# Patient Record
Sex: Female | Born: 1975 | Race: White | Hispanic: No | Smoking: Current every day smoker
Health system: Southern US, Community
[De-identification: ages and names within clinical notes are randomized; demographics above are authoritative.]

## PROBLEM LIST (undated history)

## (undated) ENCOUNTER — Inpatient Hospital Stay (HOSPITAL_COMMUNITY): Payer: Self-pay

## (undated) DIAGNOSIS — C801 Malignant (primary) neoplasm, unspecified: Secondary | ICD-10-CM

## (undated) DIAGNOSIS — R51 Headache: Secondary | ICD-10-CM

## (undated) DIAGNOSIS — F419 Anxiety disorder, unspecified: Secondary | ICD-10-CM

## (undated) DIAGNOSIS — T8859XA Other complications of anesthesia, initial encounter: Secondary | ICD-10-CM

## (undated) DIAGNOSIS — T4145XA Adverse effect of unspecified anesthetic, initial encounter: Secondary | ICD-10-CM

## (undated) DIAGNOSIS — K7689 Other specified diseases of liver: Secondary | ICD-10-CM

## (undated) DIAGNOSIS — Z9889 Other specified postprocedural states: Secondary | ICD-10-CM

## (undated) DIAGNOSIS — N189 Chronic kidney disease, unspecified: Secondary | ICD-10-CM

## (undated) DIAGNOSIS — R519 Headache, unspecified: Secondary | ICD-10-CM

## (undated) DIAGNOSIS — R112 Nausea with vomiting, unspecified: Secondary | ICD-10-CM

## (undated) DIAGNOSIS — N200 Calculus of kidney: Secondary | ICD-10-CM

## (undated) DIAGNOSIS — Z8719 Personal history of other diseases of the digestive system: Secondary | ICD-10-CM

## (undated) DIAGNOSIS — J45909 Unspecified asthma, uncomplicated: Secondary | ICD-10-CM

## (undated) DIAGNOSIS — Z8489 Family history of other specified conditions: Secondary | ICD-10-CM

## (undated) HISTORY — PX: DIAGNOSTIC LAPAROSCOPY: SUR761

## (undated) HISTORY — PX: WISDOM TOOTH EXTRACTION: SHX21

## (undated) HISTORY — DX: Calculus of kidney: N20.0

## (undated) HISTORY — PX: UPPER GI ENDOSCOPY: SHX6162

---

## 1998-12-09 ENCOUNTER — Encounter: Admission: RE | Admit: 1998-12-09 | Discharge: 1998-12-09 | Payer: Self-pay | Admitting: Emergency Medicine

## 1998-12-09 ENCOUNTER — Encounter: Payer: Self-pay | Admitting: Emergency Medicine

## 1999-05-01 ENCOUNTER — Ambulatory Visit (HOSPITAL_COMMUNITY): Admission: RE | Admit: 1999-05-01 | Discharge: 1999-05-01 | Payer: Self-pay | Admitting: Gastroenterology

## 1999-05-01 ENCOUNTER — Encounter: Payer: Self-pay | Admitting: Gastroenterology

## 1999-05-06 ENCOUNTER — Ambulatory Visit (HOSPITAL_COMMUNITY): Admission: RE | Admit: 1999-05-06 | Discharge: 1999-05-06 | Payer: Self-pay | Admitting: Gastroenterology

## 2004-03-24 ENCOUNTER — Other Ambulatory Visit: Admission: RE | Admit: 2004-03-24 | Discharge: 2004-03-24 | Payer: Self-pay | Admitting: Family Medicine

## 2006-02-11 ENCOUNTER — Emergency Department: Payer: Self-pay | Admitting: Emergency Medicine

## 2007-01-05 ENCOUNTER — Emergency Department: Payer: Self-pay | Admitting: Emergency Medicine

## 2007-09-11 ENCOUNTER — Emergency Department: Payer: Self-pay | Admitting: Emergency Medicine

## 2007-12-15 ENCOUNTER — Emergency Department: Payer: Self-pay | Admitting: Emergency Medicine

## 2009-12-06 ENCOUNTER — Emergency Department: Payer: Self-pay | Admitting: Emergency Medicine

## 2010-07-07 ENCOUNTER — Inpatient Hospital Stay (INDEPENDENT_AMBULATORY_CARE_PROVIDER_SITE_OTHER)
Admission: RE | Admit: 2010-07-07 | Discharge: 2010-07-07 | Disposition: A | Discharge status: Home / Self Care | Payer: 59 | Source: Ambulatory Visit | Attending: Family Medicine | Admitting: Family Medicine

## 2010-07-07 DIAGNOSIS — J40 Bronchitis, not specified as acute or chronic: Secondary | ICD-10-CM

## 2010-11-27 ENCOUNTER — Emergency Department: Payer: Self-pay | Admitting: Emergency Medicine

## 2010-11-28 ENCOUNTER — Emergency Department: Payer: Self-pay | Admitting: Emergency Medicine

## 2013-12-14 ENCOUNTER — Emergency Department: Payer: Self-pay | Admitting: Emergency Medicine

## 2013-12-14 LAB — URINALYSIS, COMPLETE
BACTERIA: NONE SEEN
Bilirubin,UR: NEGATIVE
GLUCOSE, UR: NEGATIVE mg/dL (ref 0–75)
Ketone: NEGATIVE
Leukocyte Esterase: NEGATIVE
NITRITE: NEGATIVE
Ph: 6 (ref 4.5–8.0)
Specific Gravity: 1.02 (ref 1.003–1.030)
Squamous Epithelial: 3
WBC UR: 3 /HPF (ref 0–5)

## 2013-12-14 LAB — CBC
HCT: 42.4 % (ref 35.0–47.0)
HGB: 14.3 g/dL (ref 12.0–16.0)
MCH: 31.3 pg (ref 26.0–34.0)
MCHC: 33.6 g/dL (ref 32.0–36.0)
MCV: 93 fL (ref 80–100)
Platelet: 266 10*3/uL (ref 150–440)
RBC: 4.56 10*6/uL (ref 3.80–5.20)
RDW: 12.6 % (ref 11.5–14.5)
WBC: 6.7 10*3/uL (ref 3.6–11.0)

## 2013-12-14 LAB — COMPREHENSIVE METABOLIC PANEL
ALBUMIN: 3.7 g/dL (ref 3.4–5.0)
Alkaline Phosphatase: 38 U/L — ABNORMAL LOW
Anion Gap: 9 (ref 7–16)
BUN: 14 mg/dL (ref 7–18)
Bilirubin,Total: 0.6 mg/dL (ref 0.2–1.0)
CHLORIDE: 106 mmol/L (ref 98–107)
CO2: 27 mmol/L (ref 21–32)
Calcium, Total: 8.6 mg/dL (ref 8.5–10.1)
Creatinine: 0.93 mg/dL (ref 0.60–1.30)
EGFR (African American): >60
GLUCOSE: 97 mg/dL (ref 65–99)
Osmolality: 284 (ref 275–301)
Potassium: 3.9 mmol/L (ref 3.5–5.1)
SGOT(AST): 10 U/L — ABNORMAL LOW (ref 15–37)
SGPT (ALT): 16 U/L
Sodium: 142 mmol/L (ref 136–145)
TOTAL PROTEIN: 6.8 g/dL (ref 6.4–8.2)

## 2014-03-14 ENCOUNTER — Ambulatory Visit (INDEPENDENT_AMBULATORY_CARE_PROVIDER_SITE_OTHER): Payer: Self-pay | Admitting: General Surgery

## 2014-03-28 ENCOUNTER — Other Ambulatory Visit (HOSPITAL_COMMUNITY): Payer: Self-pay | Admitting: *Deleted

## 2014-03-29 ENCOUNTER — Encounter (HOSPITAL_COMMUNITY): Payer: Self-pay

## 2014-03-29 ENCOUNTER — Ambulatory Visit (INDEPENDENT_AMBULATORY_CARE_PROVIDER_SITE_OTHER): Payer: Self-pay | Admitting: General Surgery

## 2014-03-29 ENCOUNTER — Encounter (HOSPITAL_COMMUNITY)
Admission: RE | Admit: 2014-03-29 | Discharge: 2014-03-29 | Disposition: A | Discharge status: Home / Self Care | Payer: Medicaid Other | Source: Ambulatory Visit | Attending: General Surgery | Admitting: General Surgery

## 2014-03-29 DIAGNOSIS — J45909 Unspecified asthma, uncomplicated: Secondary | ICD-10-CM | POA: Diagnosis not present

## 2014-03-29 DIAGNOSIS — Z79899 Other long term (current) drug therapy: Secondary | ICD-10-CM | POA: Diagnosis not present

## 2014-03-29 DIAGNOSIS — Z87442 Personal history of urinary calculi: Secondary | ICD-10-CM | POA: Diagnosis not present

## 2014-03-29 DIAGNOSIS — F17200 Nicotine dependence, unspecified, uncomplicated: Secondary | ICD-10-CM | POA: Diagnosis not present

## 2014-03-29 DIAGNOSIS — K219 Gastro-esophageal reflux disease without esophagitis: Secondary | ICD-10-CM | POA: Diagnosis not present

## 2014-03-29 DIAGNOSIS — Z8541 Personal history of malignant neoplasm of cervix uteri: Secondary | ICD-10-CM | POA: Diagnosis not present

## 2014-03-29 DIAGNOSIS — G43909 Migraine, unspecified, not intractable, without status migrainosus: Secondary | ICD-10-CM | POA: Diagnosis not present

## 2014-03-29 DIAGNOSIS — K8020 Calculus of gallbladder without cholecystitis without obstruction: Secondary | ICD-10-CM | POA: Diagnosis not present

## 2014-03-29 DIAGNOSIS — Z859 Personal history of malignant neoplasm, unspecified: Secondary | ICD-10-CM | POA: Diagnosis not present

## 2014-03-29 DIAGNOSIS — F329 Major depressive disorder, single episode, unspecified: Secondary | ICD-10-CM | POA: Diagnosis not present

## 2014-03-29 HISTORY — DX: Other specified diseases of liver: K76.89

## 2014-03-29 HISTORY — DX: Anxiety disorder, unspecified: F41.9

## 2014-03-29 HISTORY — DX: Headache, unspecified: R51.9

## 2014-03-29 HISTORY — DX: Other specified postprocedural states: R11.2

## 2014-03-29 HISTORY — DX: Headache: R51

## 2014-03-29 HISTORY — DX: Unspecified asthma, uncomplicated: J45.909

## 2014-03-29 HISTORY — DX: Adverse effect of unspecified anesthetic, initial encounter: T41.45XA

## 2014-03-29 HISTORY — DX: Nausea with vomiting, unspecified: Z98.890

## 2014-03-29 HISTORY — DX: Family history of other specified conditions: Z84.89

## 2014-03-29 HISTORY — DX: Other complications of anesthesia, initial encounter: T88.59XA

## 2014-03-29 HISTORY — DX: Chronic kidney disease, unspecified: N18.9

## 2014-03-29 HISTORY — DX: Personal history of other diseases of the digestive system: Z87.19

## 2014-03-29 HISTORY — DX: Malignant (primary) neoplasm, unspecified: C80.1

## 2014-03-29 LAB — BASIC METABOLIC PANEL
ANION GAP: 9 (ref 5–15)
BUN: 8 mg/dL (ref 6–23)
CO2: 23 mmol/L (ref 19–32)
CREATININE: 0.71 mg/dL (ref 0.50–1.10)
Calcium: 9.1 mg/dL (ref 8.4–10.5)
Chloride: 107 mmol/L (ref 96–112)
GFR calc non Af Amer: >90 mL/min (ref >90)
Glucose, Bld: 86 mg/dL (ref 70–99)
POTASSIUM: 3.7 mmol/L (ref 3.5–5.1)
Sodium: 139 mmol/L (ref 135–145)

## 2014-03-29 LAB — HCG, SERUM, QUALITATIVE: PREG SERUM: NEGATIVE

## 2014-03-29 LAB — CBC
HCT: 42.6 % (ref 36.0–46.0)
HEMOGLOBIN: 14.4 g/dL (ref 12.0–15.0)
MCH: 30.8 pg (ref 26.0–34.0)
MCHC: 33.8 g/dL (ref 30.0–36.0)
MCV: 91.2 fL (ref 78.0–100.0)
PLATELETS: 250 10*3/uL (ref 150–400)
RBC: 4.67 MIL/uL (ref 3.87–5.11)
RDW: 13.1 % (ref 11.5–15.5)
WBC: 5.2 10*3/uL (ref 4.0–10.5)

## 2014-03-29 NOTE — Progress Notes (Signed)
Pt. Denies any changes, concerns about her breathing or heart. Pt. Thought Dr. Grandville Silos would be doing liver biopsy also with surgery.  Call to [facility], reported the same to nurse; also to alert Dr. Grandville Silos, pt.  has had hives with PCN & Ceclor in the past.

## 2014-03-29 NOTE — Pre-Procedure Instructions (Signed)
Essance Gatti  03/29/2014   Your procedure is scheduled on:  04/02/2014  Report to [facility] Admitting at 5:30 AM.  Call this number if you have problems the morning of surgery: [phone]   Remember:   Do not eat food or drink liquids after midnight.  On Sunday night    Take these medicines the morning of surgery with A SIP OF WATER: you can have Lorazepam if needed a.m. Of surgery    Do not wear jewelry, make-up or nail polish.   Do not wear lotions, powders, or perfumes. You may wear deodorant.   Do not shave 48 hours prior to surgery. Men may shave face and neck.  Do not bring valuables to the hospital.  [facility] is not responsible                  for any belongings or valuables.               Contacts, dentures or bridgework may not be worn into surgery.   Leave suitcase in the car. After surgery it may be brought to your room.   For patients admitted to the hospital, discharge time is determined by your                treatment team.               Patients discharged the day of surgery will not be allowed to drive  home.  Name and phone number of your driver: /w pt.'s Mother   Special Instructions: Special Instructions:  - Preparing for Surgery  Before surgery, you can play an important role.  Because skin is not sterile, your skin needs to be as free of germs as possible.  You can reduce the number of germs on you skin by washing with CHG (chlorahexidine gluconate) soap before surgery.  CHG is an antiseptic cleaner which kills germs and bonds with the skin to continue killing germs even after washing.  Please DO NOT use if you have an allergy to CHG or antibacterial soaps.  If your skin becomes reddened/irritated stop using the CHG and inform your nurse when you arrive at Short Stay.  Do not shave (including legs and underarms) for at least 48 hours prior to the first CHG shower.  You may shave your face.  Please follow these instructions  carefully:   1.  Shower with CHG Soap the night before surgery and the  morning of Surgery.  2.  If you choose to wash your hair, wash your hair first as usual with your  normal shampoo.  3.  After you shampoo, rinse your hair and body thoroughly to remove the  Shampoo.  4.  Use CHG as you would any other liquid soap.  You can apply chg directly to the skin and wash gently with scrungie or a clean washcloth.  5.  Apply the CHG Soap to your body ONLY FROM THE NECK DOWN.    Do not use on open wounds or open sores.  Avoid contact with your eyes, ears, mouth and genitals (private parts).  Wash genitals (private parts)   with your normal soap.  6.  Wash thoroughly, paying special attention to the area where your surgery will be performed.  7.  Thoroughly rinse your body with warm water from the neck down.  8.  DO NOT shower/wash with your normal soap after using and rinsing off   the CHG Soap.  9.  Pat yourself dry with a clean towel.            10.  Wear clean pajamas.            11.  Place clean sheets on your bed the night of your first shower and do not sleep with pets.  Day of Surgery  Do not apply any lotions/deodorants the morning of surgery.  Please wear clean clothes to the hospital/surgery center.   Please read over the following fact sheets that you were given: Pain Booklet, Coughing and Deep Breathing and Surgical Site Infection Prevention

## 2014-04-01 MED ORDER — CIPROFLOXACIN IN D5W 400 MG/200ML IV SOLN
400.0000 mg | Freq: Two times a day (BID) | INTRAVENOUS | Status: DC
  Administered 2014-04-02: 400 mg via INTRAVENOUS
  Filled 2014-04-01: qty 200

## 2014-04-02 ENCOUNTER — Ambulatory Visit (HOSPITAL_COMMUNITY): Payer: Medicaid Other | Admitting: Anesthesiology

## 2014-04-02 ENCOUNTER — Encounter (HOSPITAL_COMMUNITY): Payer: Self-pay

## 2014-04-02 ENCOUNTER — Ambulatory Visit (HOSPITAL_COMMUNITY)
Admission: RE | Admit: 2014-04-02 | Discharge: 2014-04-02 | Disposition: A | Discharge status: Home / Self Care | Payer: Medicaid Other | Source: Ambulatory Visit | Attending: General Surgery | Admitting: General Surgery

## 2014-04-02 ENCOUNTER — Encounter (HOSPITAL_COMMUNITY)
Admission: RE | Disposition: A | Discharge status: Home / Self Care | Payer: Self-pay | Source: Ambulatory Visit | Attending: General Surgery

## 2014-04-02 DIAGNOSIS — K219 Gastro-esophageal reflux disease without esophagitis: Secondary | ICD-10-CM | POA: Diagnosis not present

## 2014-04-02 DIAGNOSIS — Z859 Personal history of malignant neoplasm, unspecified: Secondary | ICD-10-CM | POA: Insufficient documentation

## 2014-04-02 DIAGNOSIS — Z87442 Personal history of urinary calculi: Secondary | ICD-10-CM | POA: Insufficient documentation

## 2014-04-02 DIAGNOSIS — Z79899 Other long term (current) drug therapy: Secondary | ICD-10-CM | POA: Insufficient documentation

## 2014-04-02 DIAGNOSIS — K8020 Calculus of gallbladder without cholecystitis without obstruction: Secondary | ICD-10-CM | POA: Diagnosis not present

## 2014-04-02 DIAGNOSIS — G43909 Migraine, unspecified, not intractable, without status migrainosus: Secondary | ICD-10-CM | POA: Insufficient documentation

## 2014-04-02 DIAGNOSIS — F329 Major depressive disorder, single episode, unspecified: Secondary | ICD-10-CM | POA: Diagnosis not present

## 2014-04-02 DIAGNOSIS — Z8541 Personal history of malignant neoplasm of cervix uteri: Secondary | ICD-10-CM | POA: Insufficient documentation

## 2014-04-02 DIAGNOSIS — J45909 Unspecified asthma, uncomplicated: Secondary | ICD-10-CM | POA: Insufficient documentation

## 2014-04-02 DIAGNOSIS — F17200 Nicotine dependence, unspecified, uncomplicated: Secondary | ICD-10-CM | POA: Insufficient documentation

## 2014-04-02 HISTORY — PX: CHOLECYSTECTOMY: SHX55

## 2014-04-02 SURGERY — LAPAROSCOPIC CHOLECYSTECTOMY WITH INTRAOPERATIVE CHOLANGIOGRAM
Anesthesia: General

## 2014-04-02 MED ORDER — ROCURONIUM BROMIDE 50 MG/5ML IV SOLN
INTRAVENOUS | Status: AC
  Filled 2014-04-02: qty 1

## 2014-04-02 MED ORDER — EPHEDRINE SULFATE 50 MG/ML IJ SOLN
INTRAMUSCULAR | Status: AC
  Filled 2014-04-02: qty 1

## 2014-04-02 MED ORDER — PROPOFOL 10 MG/ML IV BOLUS
INTRAVENOUS | Status: AC
  Filled 2014-04-02: qty 20

## 2014-04-02 MED ORDER — MIDAZOLAM HCL 5 MG/5ML IJ SOLN
INTRAMUSCULAR | Status: DC | PRN
  Administered 2014-04-02 (×2): 1 mg via INTRAVENOUS

## 2014-04-02 MED ORDER — DIPHENHYDRAMINE HCL 50 MG/ML IJ SOLN
INTRAMUSCULAR | Status: AC
  Filled 2014-04-02: qty 1

## 2014-04-02 MED ORDER — PROPOFOL 10 MG/ML IV BOLUS
INTRAVENOUS | Status: DC | PRN
  Administered 2014-04-02: 140 mg via INTRAVENOUS

## 2014-04-02 MED ORDER — OXYCODONE HCL 5 MG PO TABS: 5.0000 mg | ORAL_TABLET | Freq: Four times a day (QID) | ORAL | Status: DC | PRN

## 2014-04-02 MED ORDER — DIPHENHYDRAMINE HCL 50 MG/ML IJ SOLN
12.5000 mg | Freq: Once | INTRAMUSCULAR | Status: AC
  Administered 2014-04-02: 12.5 mg via INTRAVENOUS

## 2014-04-02 MED ORDER — PROMETHAZINE HCL 25 MG/ML IJ SOLN
12.5000 mg | INTRAMUSCULAR | Status: DC | PRN
  Filled 2014-04-02: qty 1

## 2014-04-02 MED ORDER — LIDOCAINE HCL (CARDIAC) 20 MG/ML IV SOLN
INTRAVENOUS | Status: AC
  Filled 2014-04-02: qty 5

## 2014-04-02 MED ORDER — FENTANYL CITRATE 0.05 MG/ML IJ SOLN
INTRAMUSCULAR | Status: AC
  Filled 2014-04-02: qty 5

## 2014-04-02 MED ORDER — SCOPOLAMINE 1 MG/3DAYS TD PT72
MEDICATED_PATCH | TRANSDERMAL | Status: AC
  Administered 2014-04-02: 1 via TRANSDERMAL
  Filled 2014-04-02: qty 1

## 2014-04-02 MED ORDER — DEXAMETHASONE SODIUM PHOSPHATE 4 MG/ML IJ SOLN
INTRAMUSCULAR | Status: DC | PRN
  Administered 2014-04-02: 4 mg via INTRAVENOUS

## 2014-04-02 MED ORDER — FENTANYL CITRATE 0.05 MG/ML IJ SOLN
INTRAMUSCULAR | Status: DC | PRN
  Administered 2014-04-02: 150 ug via INTRAVENOUS
  Administered 2014-04-02 (×4): 50 ug via INTRAVENOUS

## 2014-04-02 MED ORDER — BUPIVACAINE-EPINEPHRINE (PF) 0.25% -1:200000 IJ SOLN
INTRAMUSCULAR | Status: AC
  Filled 2014-04-02: qty 30

## 2014-04-02 MED ORDER — 0.9 % SODIUM CHLORIDE (POUR BTL) OPTIME
TOPICAL | Status: DC | PRN
  Administered 2014-04-02: 1000 mL

## 2014-04-02 MED ORDER — BUPIVACAINE-EPINEPHRINE 0.25% -1:200000 IJ SOLN
INTRAMUSCULAR | Status: DC | PRN
  Administered 2014-04-02: 20 mL

## 2014-04-02 MED ORDER — SUCCINYLCHOLINE CHLORIDE 20 MG/ML IJ SOLN
INTRAMUSCULAR | Status: AC
  Filled 2014-04-02: qty 1

## 2014-04-02 MED ORDER — PROMETHAZINE HCL 12.5 MG PO TABS: 12.5000 mg | ORAL_TABLET | Freq: Four times a day (QID) | ORAL | Status: DC | PRN

## 2014-04-02 MED ORDER — GLYCOPYRROLATE 0.2 MG/ML IJ SOLN
INTRAMUSCULAR | Status: AC
  Filled 2014-04-02: qty 2

## 2014-04-02 MED ORDER — HYDROMORPHONE HCL 1 MG/ML IJ SOLN
INTRAMUSCULAR | Status: AC
  Filled 2014-04-02: qty 1

## 2014-04-02 MED ORDER — OXYCODONE HCL 5 MG PO TABS: 5.0000 mg | ORAL_TABLET | ORAL | Status: DC | PRN

## 2014-04-02 MED ORDER — ARTIFICIAL TEARS OP OINT
TOPICAL_OINTMENT | OPHTHALMIC | Status: DC | PRN
  Administered 2014-04-02: 1 via OPHTHALMIC

## 2014-04-02 MED ORDER — ROCURONIUM BROMIDE 100 MG/10ML IV SOLN
INTRAVENOUS | Status: DC | PRN
  Administered 2014-04-02: 40 mg via INTRAVENOUS

## 2014-04-02 MED ORDER — GLYCOPYRROLATE 0.2 MG/ML IJ SOLN
INTRAMUSCULAR | Status: DC | PRN
  Administered 2014-04-02: 0.6 mg via INTRAVENOUS

## 2014-04-02 MED ORDER — DIPHENHYDRAMINE HCL 50 MG/ML IJ SOLN
INTRAMUSCULAR | Status: DC | PRN
  Administered 2014-04-02: 12.5 mg via INTRAVENOUS

## 2014-04-02 MED ORDER — PHENYLEPHRINE HCL 10 MG/ML IJ SOLN
INTRAMUSCULAR | Status: DC | PRN
  Administered 2014-04-02: 80 ug via INTRAVENOUS

## 2014-04-02 MED ORDER — SODIUM CHLORIDE 0.9 % IJ SOLN
INTRAMUSCULAR | Status: AC
  Filled 2014-04-02: qty 10

## 2014-04-02 MED ORDER — GLYCOPYRROLATE 0.2 MG/ML IJ SOLN
INTRAMUSCULAR | Status: AC
  Filled 2014-04-02: qty 3

## 2014-04-02 MED ORDER — NEOSTIGMINE METHYLSULFATE 10 MG/10ML IV SOLN
INTRAVENOUS | Status: DC | PRN
  Administered 2014-04-02: 4 mg via INTRAVENOUS

## 2014-04-02 MED ORDER — ONDANSETRON HCL 4 MG/2ML IJ SOLN
INTRAMUSCULAR | Status: DC | PRN
Start: 2014-04-02 — End: 2014-04-02
  Administered 2014-04-02: 4 mg via INTRAVENOUS

## 2014-04-02 MED ORDER — DEXAMETHASONE SODIUM PHOSPHATE 4 MG/ML IJ SOLN
INTRAMUSCULAR | Status: AC
  Filled 2014-04-02: qty 1

## 2014-04-02 MED ORDER — SODIUM CHLORIDE 0.9 % IV SOLN
INTRAVENOUS | Status: DC | PRN
  Administered 2014-04-02: 50 mL

## 2014-04-02 MED ORDER — PHENYLEPHRINE 40 MCG/ML (10ML) SYRINGE FOR IV PUSH (FOR BLOOD PRESSURE SUPPORT)
PREFILLED_SYRINGE | INTRAVENOUS | Status: AC
  Filled 2014-04-02: qty 10

## 2014-04-02 MED ORDER — HYDROMORPHONE HCL 1 MG/ML IJ SOLN
0.2500 mg | INTRAMUSCULAR | Status: DC | PRN
  Administered 2014-04-02 (×4): 0.5 mg via INTRAVENOUS

## 2014-04-02 MED ORDER — MIDAZOLAM HCL 2 MG/2ML IJ SOLN
INTRAMUSCULAR | Status: AC
  Filled 2014-04-02: qty 2

## 2014-04-02 MED ORDER — CHLORHEXIDINE GLUCONATE 4 % EX LIQD
1.0000 "application " | Freq: Once | CUTANEOUS | Status: DC
  Filled 2014-04-02: qty 15

## 2014-04-02 MED ORDER — ONDANSETRON HCL 4 MG/2ML IJ SOLN
INTRAMUSCULAR | Status: AC
  Filled 2014-04-02: qty 2

## 2014-04-02 MED ORDER — LIDOCAINE HCL 4 % MT SOLN
OROMUCOSAL | Status: DC | PRN
  Administered 2014-04-02: 4 mL via TOPICAL

## 2014-04-02 MED ORDER — SODIUM CHLORIDE 0.9 % IR SOLN
Status: DC | PRN
  Administered 2014-04-02: 1000 mL

## 2014-04-02 MED ORDER — LACTATED RINGERS IV SOLN
INTRAVENOUS | Status: DC | PRN
  Administered 2014-04-02: 07:00:00 via INTRAVENOUS

## 2014-04-02 SURGICAL SUPPLY — 46 items
APPLIER CLIP 5 13 M/L LIGAMAX5 (MISCELLANEOUS) ×3
APR CLP MED LRG 5 ANG JAW (MISCELLANEOUS) ×1
BAG SPEC RTRVL 10 TROC 200 (ENDOMECHANICALS) ×1
CANISTER SUCTION 2500CC (MISCELLANEOUS) ×3 IMPLANT
CHLORAPREP W/TINT 26ML (MISCELLANEOUS) ×3 IMPLANT
CLIP APPLIE 5 13 M/L LIGAMAX5 (MISCELLANEOUS) ×1 IMPLANT
CLOSURE STERI-STRIP 1/2X4 (GAUZE/BANDAGES/DRESSINGS) ×1
CLSR STERI-STRIP ANTIMIC 1/2X4 (GAUZE/BANDAGES/DRESSINGS) ×1 IMPLANT
COVER MAYO STAND STRL (DRAPES) ×3 IMPLANT
COVER SURGICAL LIGHT HANDLE (MISCELLANEOUS) ×3 IMPLANT
DRAPE C-ARM 42X72 X-RAY (DRAPES) ×3 IMPLANT
DRAPE LAPAROSCOPIC ABDOMINAL (DRAPES) ×3 IMPLANT
DRSG TEGADERM 2-3/8X2-3/4 SM (GAUZE/BANDAGES/DRESSINGS) ×4 IMPLANT
ELECT REM PT RETURN 9FT ADLT (ELECTROSURGICAL) ×3
ELECTRODE REM PT RTRN 9FT ADLT (ELECTROSURGICAL) ×1 IMPLANT
FILTER SMOKE EVAC LAPAROSHD (FILTER) ×2 IMPLANT
GAUZE SPONGE 2X2 8PLY STRL LF (GAUZE/BANDAGES/DRESSINGS) IMPLANT
GLOVE BIO SURGEON STRL SZ7 (GLOVE) ×4 IMPLANT
GLOVE BIO SURGEON STRL SZ8 (GLOVE) ×5 IMPLANT
GLOVE BIOGEL PI IND STRL 7.0 (GLOVE) IMPLANT
GLOVE BIOGEL PI IND STRL 8 (GLOVE) ×1 IMPLANT
GLOVE BIOGEL PI INDICATOR 7.0 (GLOVE) ×2
GLOVE BIOGEL PI INDICATOR 8 (GLOVE) ×2
GOWN STRL REUS W/ TWL LRG LVL3 (GOWN DISPOSABLE) ×2 IMPLANT
GOWN STRL REUS W/ TWL XL LVL3 (GOWN DISPOSABLE) ×1 IMPLANT
GOWN STRL REUS W/TWL LRG LVL3 (GOWN DISPOSABLE) ×6
GOWN STRL REUS W/TWL XL LVL3 (GOWN DISPOSABLE) ×3
KIT BASIN OR (CUSTOM PROCEDURE TRAY) ×3 IMPLANT
KIT ROOM TURNOVER OR (KITS) ×3 IMPLANT
LIQUID BAND (GAUZE/BANDAGES/DRESSINGS) ×3 IMPLANT
NS IRRIG 1000ML POUR BTL (IV SOLUTION) ×3 IMPLANT
PAD ARMBOARD 7.5X6 YLW CONV (MISCELLANEOUS) ×3 IMPLANT
POUCH RETRIEVAL ECOSAC 10 (ENDOMECHANICALS) ×1 IMPLANT
POUCH RETRIEVAL ECOSAC 10MM (ENDOMECHANICALS) ×2
SCISSORS LAP 5X35 DISP (ENDOMECHANICALS) ×3 IMPLANT
SET CHOLANGIOGRAPH 5 50 .035 (SET/KITS/TRAYS/PACK) ×3 IMPLANT
SET IRRIG TUBING LAPAROSCOPIC (IRRIGATION / IRRIGATOR) ×3 IMPLANT
SLEEVE ENDOPATH XCEL 5M (ENDOMECHANICALS) ×6 IMPLANT
SPECIMEN JAR SMALL (MISCELLANEOUS) ×3 IMPLANT
SPONGE GAUZE 2X2 STER 10/PKG (GAUZE/BANDAGES/DRESSINGS) ×2
SUT VIC AB 4-0 PS2 27 (SUTURE) ×3 IMPLANT
TOWEL OR 17X26 10 PK STRL BLUE (TOWEL DISPOSABLE) ×3 IMPLANT
TRAY LAPAROSCOPIC (CUSTOM PROCEDURE TRAY) ×3 IMPLANT
TROCAR XCEL BLUNT TIP 100MML (ENDOMECHANICALS) ×3 IMPLANT
TROCAR XCEL NON-BLD 5MMX100MML (ENDOMECHANICALS) ×3 IMPLANT
TUBING INSUFFLATION (TUBING) ×3 IMPLANT

## 2014-04-02 NOTE — Transfer of Care (Signed)
Immediate Anesthesia Transfer of Care Note  Patient: Savannah Moore  Procedure(s) Performed: Procedure(s): LAPAROSCOPIC CHOLECYSTECTOMY (N/A)  Patient Location: PACU  Anesthesia Type:General  Level of Consciousness: awake, alert  and oriented  Airway & Oxygen Therapy: Patient Spontanous Breathing and Patient connected to nasal cannula oxygen  Post-op Assessment: Report given to RN and Post -op Vital signs reviewed and stable  Post vital signs: Reviewed and stable  Last Vitals:  Filed Vitals:   04/02/14 0602  BP: 111/64  Pulse: 77  Temp: 36.5 C  Resp: 18    Complications: No apparent anesthesia complications

## 2014-04-02 NOTE — Discharge Instructions (Signed)
What to eat: ° °For your first meals, you should eat lightly; only small meals initially.  If you do not have nausea, you may eat larger meals.  Avoid spicy, greasy and heavy food.   ° °General Anesthesia, Adult, Care After  °Refer to this sheet in the next few weeks. These instructions provide you with information on caring for yourself after your procedure. Your health care provider may also give you more specific instructions. Your treatment has been planned according to current medical practices, but problems sometimes occur. Call your health care provider if you have any problems or questions after your procedure.  °WHAT TO EXPECT AFTER THE PROCEDURE  °After the procedure, it is typical to experience:  °Sleepiness.  °Nausea and vomiting. °HOME CARE INSTRUCTIONS  °For the first 24 hours after general anesthesia:  °Have a responsible person with you.  °Do not drive a car. If you are alone, do not take public transportation.  °Do not drink alcohol.  °Do not take medicine that has not been prescribed by your health care provider.  °Do not sign important papers or make important decisions.  °You may resume a normal diet and activities as directed by your health care provider.  °Change bandages (dressings) as directed.  °If you have questions or problems that seem related to general anesthesia, call the hospital and ask for the anesthetist or anesthesiologist on call. °SEEK MEDICAL CARE IF:  °You have nausea and vomiting that continue the day after anesthesia.  °You develop a rash. °SEEK IMMEDIATE MEDICAL CARE IF:  °You have difficulty breathing.  °You have chest pain.  °You have any allergic problems. °Document Released: 04/27/2000 Document Revised: 09/21/2012 Document Reviewed: 08/04/2012  °ExitCare® Patient Information ©2014 [employer].  ° °Sore Throat  ° ° °A sore throat is a painful, burning, sore, or scratchy feeling of the throat. There may be pain or tenderness when swallowing or talking. You may have  other symptoms with a sore throat. These include coughing, sneezing, fever, or a swollen neck. A sore throat is often the first sign of another sickness. These sicknesses may include a cold, flu, strep throat, or an infection called mono. Most sore throats go away without medical treatment.  °HOME CARE  °Only take medicine as told by your doctor.  °Drink enough fluids to keep your pee (urine) clear or pale yellow.  °Rest as needed.  °Try using throat sprays, lozenges, or suck on hard candy (if older than 4 years or as told).  °Sip warm liquids, such as broth, herbal tea, or warm water with honey. Try sucking on frozen ice pops or drinking cold liquids.  °Rinse the mouth (gargle) with salt water. Mix 1 teaspoon salt with 8 ounces of water.  °Do not smoke. Avoid being around others when they are smoking.  °Put a humidifier in your bedroom at night to moisten the air. You can also turn on a hot shower and sit in the bathroom for 5-10 minutes. Be sure the bathroom door is closed. °GET HELP RIGHT AWAY IF:  °You have trouble breathing.  °You cannot swallow fluids, soft foods, or your spit (saliva).  °You have more puffiness (swelling) in the throat.  °Your sore throat does not get better in 7 days.  °You feel sick to your stomach (nauseous) and throw up (vomit).  °You have a fever or lasting symptoms for more than 2-3 days.  °You have a fever and your symptoms suddenly get worse. °MAKE SURE YOU:  °Understand these   instructions.  °Will watch your condition.  °Will get help right away if you are not doing well or get worse. °Document Released: 10/29/2007 Document Revised: 10/14/2011 Document Reviewed: 09/27/2011  °ExitCare® Patient Information ©2015 [employer]. This information is not intended to replace advice given to you by your health care provider. Make sure you discuss any questions you have with your health care provider.  ° ° °Tissue Adhesive Wound Care  ° ° °Some cuts and wounds can be closed with tissue  adhesive. Adhesive is like glue. It holds the skin together and helps a wound heal faster. This adhesive goes away on its own as the wound heals.  °HOME CARE  °Showers are allowed. Do not soak the wound in water. Do not take baths, swim, or use hot tubs. Do not use soaps or creams on your wound.  °If a bandage (dressing) was put on, change it as often as told by your doctor.  °Keep the bandage dry.  °Do not scratch, pick, or rub the adhesive.  °Do not put tape over the adhesive. The adhesive could come off.  °Protect the wound from another injury.  °Protect the wound from sun and tanning beds.  °Only take medicine as told by your doctor.  °Keep all doctor visits as told. °GET HELP RIGHT AWAY IF:  °Your wound is red, puffy (swollen), hot, or tender.  °You get a rash after the glue is put on.  °You have more pain in the wound.  °You have a red streak going away from the wound.  °You have yellowish-white fluid (pus) coming from the wound.  °You have more bleeding.  °You have a fever.  °You have chills and start to shake.  °You notice a bad smell coming from the wound.  °Your wound or adhesive breaks open. °MAKE SURE YOU:  °Understand these instructions.  °Will watch your condition.  °Will get help right away if you are not doing well or get worse. °Document Released: 10/29/2007 Document Revised: 11/09/2012 Document Reviewed: 08/10/2012  °ExitCare® Patient Information ©2015 [employer]. This information is not intended to replace advice given to you by your health care provider. Make sure you discuss any questions you have with your health care provider.  ° ° °

## 2014-04-02 NOTE — Anesthesia Procedure Notes (Signed)
Procedure Name: Intubation Date/Time: 04/02/2014 7:37 AM Performed by: Susa Loffler Pre-anesthesia Checklist: Patient identified, Timeout performed, Emergency Drugs available, Suction available and Patient being monitored Patient Re-evaluated:Patient Re-evaluated prior to inductionOxygen Delivery Method: Circle system utilized Preoxygenation: Pre-oxygenation with 100% oxygen Intubation Type: IV induction Ventilation: Mask ventilation without difficulty Laryngoscope Size: Mac and 3 Grade View: Grade I Tube type: Oral Tube size: 7.0 mm Number of attempts: 1 Airway Equipment and Method: Stylet and LTA kit utilized Placement Confirmation: ETT inserted through vocal cords under direct vision,  positive ETCO2 and breath sounds checked- equal and bilateral Secured at: 21 cm Tube secured with: Tape Dental Injury: Teeth and Oropharynx as per pre-operative assessment

## 2014-04-02 NOTE — H&P (Signed)
History of Present Illness Savannah Moore E. Grandville Silos MD; 03/14/2014 10:13 AM) Patient words: eval abd pain.  The patient is a 39 year old female who presents for evaluation of gallbladder disease. Savannah Moore has a many year history of GI complaints and issues. She had gastric emptying study and upper GI done in 2001 by Dr. Deatra Ina. She had a very large ovarian cyst and once that was removed, most of her GI complaints improved. However for about the past 6 or 7 years, she has had GI problems again. This usually consists of nausea and vomiting with bilious material. She has upper abdominal pressure extending across both sides. The episodes usually resolve spontaneously but have caused her significant issues and trouble with absences from her jobs. She underwent evaluation at [facility] emergency Department in November for a kidney stone.. At that time, she had a CT scan of her abdomen and pelvis which also showed a 1.1 cm gallstone. I was asked to see her in consultation to consider cholecystectomy.   Other Problems Savannah Moore [place], Savannah Moore; 6/56/8127 5:17 AM) Anxiety Disorder Cervical Cancer Cholelithiasis Depression Gastroesophageal Reflux Disease General anesthesia - complications Kidney Stone Migraine Headache Other disease, cancer, significant illness  Past Surgical History Savannah Pierini, Savannah Moore; 0/02/7492 4:96 AM) Oral Surgery  Diagnostic Studies History Savannah Moore [place], Savannah Moore; 7/59/1638 4:66 AM) Colonoscopy >10 years ago Mammogram never Pap Smear 1-5 years ago  Allergies Savannah Pierini, Savannah Moore; 5/99/3570 1:77 AM) Penicillin G Potassium *PENICILLINS* Ceclor *CEPHALOSPORINS* Codeine Phosphate *ANALGESICS - OPIOID* Flagyl *ANTI-INFECTIVE AGENTS - MISC.*  Medication History Savannah Pierini, Savannah Moore; 9/39/0300 9:23 AM) Hydrocodone-Acetaminophen (5-325MG  Tablet, Oral) Active. SUMAtriptan Succinate (Oral) Specific dose unknown - Active. Sprintec 28 (Oral) Specific dose unknown -  Active.  Social History Savannah Moore [place], Savannah Moore; 3/00/7622 6:33 AM) Alcohol use Occasional alcohol use. Caffeine use Carbonated beverages, Tea. No drug use Tobacco use Current every day smoker.  Family History Savannah Pierini, Savannah Moore; 3/54/5625 6:38 AM) Anesthetic complications Family Members In General, Father. Arthritis Family Members In General, Father. Cerebrovascular Accident Family Members In [place] Members In General. Colon Polyps Father, Mother. Depression Mother. Diabetes Mellitus Family Members In General. Heart Disease Family Members In General, Father. Heart disease in female family member before age 65 Hypertension Family Members In General, Father. Ischemic Bowel Disease Family Members In General. Kidney Disease Family Members In General. Melanoma Family Members In General, Father, Mother. Migraine Headache Family Members In General, Sister. Respiratory Condition Sister. Thyroid problems Family Members In General, Mother, Sister.  Pregnancy / Birth History Savannah Pierini, Savannah Moore; 9/37/[birth year] 7:68 AM) Age at menarche 72 years. Contraceptive History Oral contraceptives. Gravida 2 Irregular periods Maternal age 88-25 Para 1  Review of Systems Savannah Moore Smithey Savannah Moore; 02/17/7260 0:35 AM) General Present- Appetite Loss, Fatigue, Night Sweats and Weight Loss. Not Present- Chills, Fever and Weight Gain. Skin Present- Dryness. Not Present- Change in Wart/Mole, Hives, Jaundice, New Lesions, Non-Healing Wounds, Rash and Ulcer. HEENT Present- Seasonal Allergies. Not Present- Earache, Hearing Loss, Hoarseness, Nose Bleed, Oral Ulcers, Ringing in the Ears, Sinus Pain, Sore Throat, Visual Disturbances, Wears glasses/contact lenses and Yellow Eyes. Respiratory Not Present- Bloody sputum, Chronic Cough, Difficulty Breathing, Snoring and Wheezing. Breast Not Present- Breast Mass, Breast Pain, Nipple Discharge and Skin Changes. Cardiovascular Present-  Palpitations. Not Present- Chest Pain, Difficulty Breathing Lying Down, Leg Cramps, Rapid Heart Rate, Shortness of Breath and Swelling of Extremities. Gastrointestinal Present- Change in Bowel Habits, Nausea and Vomiting. Not Present- Abdominal Pain, Bloating, Bloody Stool, Chronic diarrhea, Constipation, Difficulty Swallowing, Excessive gas,  Gets full quickly at meals, Hemorrhoids, Indigestion and Rectal Pain. Female Genitourinary Not Present- Frequency, Nocturia, Painful Urination, Pelvic Pain and Urgency. Musculoskeletal Present- Joint Stiffness. Not Present- Back Pain, Joint Pain, Muscle Pain, Muscle Weakness and Swelling of Extremities. Neurological Present- Headaches. Not Present- Decreased Memory, Fainting, Numbness, Seizures, Tingling, Tremor, Trouble walking and Weakness. Psychiatric Present- Anxiety and Change in Sleep Pattern. Not Present- Bipolar, Depression, Fearful and Frequent crying. Endocrine Present- Hot flashes. Not Present- Cold Intolerance, Excessive Hunger, Hair Changes, Heat Intolerance and New Diabetes. Hematology Not Present- Easy Bruising, Excessive bleeding, Gland problems, HIV and Persistent Infections.   Vitals Savannah Moore Smithey Savannah Moore; 3/54/6568 12:75 AM) 03/14/2014 9:59 AM Weight: 128 lb Height: 66in Body Surface Area: 1.64 m Body Mass Index: 20.66 kg/m Temp.: 98.66F(Oral)  Pulse: 80 (Regular)  Resp.: 18 (Unlabored)  BP: 120/90 (Sitting, Left Arm, Standard)    Physical Exam Savannah Moore E. Grandville Silos MD; 03/14/2014 10:13 AM) General Mental Status-Alert. General Appearance-Consistent with stated age. Hydration-Well hydrated. Voice-Normal.  Head and Neck Head-normocephalic, atraumatic with no lesions or palpable masses.  Eye Eyeball - Bilateral-Extraocular movements intact. Sclera/Conjunctiva - Bilateral-No scleral icterus.  Chest and Lung Exam Chest and lung exam reveals -quiet, even and easy respiratory effort with no use of accessory  muscles and on auscultation, normal breath sounds, no adventitious sounds and normal vocal resonance. Inspection Chest Wall - Normal. Back - normal.  Cardiovascular Cardiovascular examination reveals -on palpation PMI is normal in location and amplitude, no palpable S3 or S4. Normal cardiac borders., normal heart sounds, regular rate and rhythm with no murmurs, carotid auscultation reveals no bruits and normal pedal pulses bilaterally.  Abdomen Inspection Inspection of the abdomen reveals - No Hernias. Skin - Scar - Periumbilical. Palpation/Percussion Palpation and Percussion of the abdomen reveal - Soft, Non Tender, No Rebound tenderness, No Rigidity (guarding) and No hepatosplenomegaly. Auscultation Auscultation of the abdomen reveals - Bowel sounds normal.  Neurologic Neurologic evaluation reveals -alert and oriented x 3 with no impairment of recent or remote memory. Mental Status-Normal.  Musculoskeletal Normal Exam - Left-Upper Extremity Strength Normal and Lower Extremity Strength Normal. Normal Exam - Right-Upper Extremity Strength Normal and Lower Extremity Strength Normal.    Assessment & Plan Savannah Moore E. Grandville Silos MD; 03/14/2014 10:14 AM) SYMPTOMATIC CHOLELITHIASIS (574.20  K80.20) Impression: I have offered laparoscopic cholecystectomy with cholangiogram. Procedure, risks, and benefits were discussed in detail with her and her mother. She is agreeable and looks forward to scheduling soon. We discussed the expected postoperative course and I answered her questions.

## 2014-04-02 NOTE — Anesthesia Preprocedure Evaluation (Addendum)
Anesthesia Evaluation  Patient identified by MRN, date of birth, ID band Patient awake    Reviewed: Allergy & Precautions, H&P , NPO status , Patient's Chart, lab work & pertinent test results  History of Anesthesia Complications (+) PONV and Emergence Delirium  Airway Mallampati: II  TM Distance: >3 FB Neck ROM: Full    Dental no notable dental hx. (+) Teeth Intact, Dental Advisory Given   Pulmonary asthma , Current Smoker,  breath sounds clear to auscultation  Pulmonary exam normal       Cardiovascular negative cardio ROS  Rhythm:Regular Rate:Normal     Neuro/Psych  Headaches, negative psych ROS   GI/Hepatic negative GI ROS, Neg liver ROS,   Endo/Other  negative endocrine ROS  Renal/GU negative Renal ROS  negative genitourinary   Musculoskeletal   Abdominal   Peds  Hematology negative hematology ROS (+)   Anesthesia Other Findings   Reproductive/Obstetrics negative OB ROS                            Anesthesia Physical Anesthesia Plan  ASA: II  Anesthesia Plan: General   Post-op Pain Management:    Induction: Intravenous  Airway Management Planned: Oral ETT  Additional Equipment:   Intra-op Plan:   Post-operative Plan: Extubation in OR  Informed Consent: I have reviewed the patients History and Physical, chart, labs and discussed the procedure including the risks, benefits and alternatives for the proposed anesthesia with the patient or authorized representative who has indicated his/her understanding and acceptance.   Dental advisory given  Plan Discussed with: CRNA  Anesthesia Plan Comments:         Anesthesia Quick Evaluation

## 2014-04-02 NOTE — Interval H&P Note (Signed)
History and Physical Interval Note:  04/02/2014 6:49 AM  Savannah Moore  has presented today for surgery, with the diagnosis of symptomatic cholelithiasis  The various methods of treatment have been discussed with the patient and family. After consideration of risks, benefits and other options for treatment, the patient has consented to  Procedure(s): LAPAROSCOPIC CHOLECYSTECTOMY WITH INTRAOPERATIVE CHOLANGIOGRAM (N/A) and possible liver biopsy as a surgical intervention .  The patient's history has been reviewed, patient re-examined, no change in status, stable for surgery.  I have reviewed the patient's chart and labs.  Questions were answered to the patient's satisfaction.     [name]

## 2014-04-02 NOTE — Op Note (Addendum)
04/02/2014  8:24 AM  PATIENT:  Savannah Moore  39 y.o. female  PRE-OPERATIVE DIAGNOSIS:  symptomatic cholelithiasis  POST-OPERATIVE DIAGNOSIS:  symptomatic cholelithiasis  PROCEDURE:  Procedure(s): LAPAROSCOPIC CHOLECYSTECTOMY  SURGEON:  Surgeon(s): Georganna Skeans, MD  ASSISTANTS: none   ANESTHESIA:   local and general  EBL:  Total I/O In: 600 [I.V.:600] Out: 10 [Blood:10]  BLOOD ADMINISTERED:none  DRAINS: none   SPECIMEN:  Excision  DISPOSITION OF SPECIMEN:  PATHOLOGY  COUNTS:  YES  DICTATION: .Thereasa Parkin presents for cholecystectomy. She was identified in the preop holding area. Informed consent was obtained. She received intravenous antibiotics. She was brought to the operating room and general endotracheal anesthesia was administered by the anesthesia staff. Her abdomen was prepped and draped in sterile fashion. We did time out procedure. Infra-umbilical region was infiltrated with local. Transverse incision was made under the umbilicus along her previous scar. Subcutis tissues were dissected down revealing the anterior fascia. This was divided sharply along the midline. Peritoneal cavity was entered under direct vision carefully. 0 Vicryl pursestring was placed around the fascial opening. Hassan trocar was inserted. Insufflated with carbon dioxide in standard fashion. Under direct vision, a 5 mm epigastric and 5 mm right abdominal port 2 were placed. Local was used at each port site. Laparoscopic exploration revealed a gallbladder with evidence of chronic inflammation. The liver was closely inspected and no abnormal lesions were identified. The dome the gallbladder was retracted superior medially. There were filmy adhesions to the body and infundibulum which were gently taken down. There were also some filmy adhesions to the duodenum. These were sharply taken down staying well away from the duodenum. This revealed the infundibulum nicely. The infundibulum was then  retracted inferior laterally. Dissection began laterally and progressed medially easily identifying the cystic duct. We also identified a anterior and posterior branch of the cystic artery. Cystic duct was further dissected until critical view between the cystic duct, the infundibulum, and the liver. Anatomy was clearly visualized and LFTs have been WNL so no IOC was done. 3 clips were placed proximally on the cystic duct, one was placed distally and it was divided. 2 clips were placed proximally on the anterior and posterior branch of the cystic artery. And these were also divided. Gallbladder was taken off the liver bed using Bovie cautery. We did encounter a small cyst right along the liver bed which only contained clear fluid. This was opened and cauterized as we took the gallbladder off the liver bed. Gallbladder was placed in an Ecosac and removed from the abdomen via the infraumbilical port site. Liver bed was copiously irrigated. Hemostasis was ensured. All clips remain in good position. Liver bed was dry. Irrigation returned clear. 4 quadrant inspection revealed no abnormalities. Ports were removed under direct vision. Pneumoperitoneum was released. An from local fascia was closed by tying the pursestring. All 4 wounds were copiously irrigated and the skin of each was closed with running 4-0 Vicryl subcuticular suture followed by LiquiBand. All counts were correct. Patient tolerated procedure well without apparent complication and was taken recovery in stable condition.  PATIENT DISPOSITION:  PACU - hemodynamically stable.   Delay start of Pharmacological VTE agent (>24hrs) due to surgical blood loss or risk of bleeding:  no  Georganna Skeans, MD, MPH, FACS Pager: [phone]  2/29/20168:24 AM

## 2014-04-02 NOTE — Anesthesia Postprocedure Evaluation (Signed)
  Anesthesia Post-op Note  Patient: Savannah Moore  Procedure(s) Performed: Procedure(s): LAPAROSCOPIC CHOLECYSTECTOMY (N/A)  Patient Location: PACU  Anesthesia Type:General  Level of Consciousness: awake and alert   Airway and Oxygen Therapy: Patient Spontanous Breathing  Post-op Pain: moderate  Post-op Assessment: Post-op Vital signs reviewed, Patient's Cardiovascular Status Stable and Respiratory Function Stable  Post-op Vital Signs: Reviewed  Filed Vitals:   04/02/14 0930  BP:   Pulse: 80  Temp:   Resp: 18    Complications: No apparent anesthesia complications

## 2014-04-03 ENCOUNTER — Encounter (HOSPITAL_COMMUNITY): Payer: Self-pay | Admitting: General Surgery

## 2014-12-31 ENCOUNTER — Telehealth: Payer: Self-pay | Admitting: Internal Medicine

## 2014-12-31 NOTE — Telephone Encounter (Signed)
ok 

## 2015-03-07 NOTE — Telephone Encounter (Signed)
Pt CB and switched to Dr. Paulita Fujita @ [facility]

## 2015-04-29 ENCOUNTER — Ambulatory Visit: Payer: Medicaid Other | Admitting: Internal Medicine

## 2016-03-20 ENCOUNTER — Other Ambulatory Visit: Payer: Self-pay | Admitting: Family Medicine

## 2016-03-20 DIAGNOSIS — Z1239 Encounter for other screening for malignant neoplasm of breast: Secondary | ICD-10-CM

## 2016-03-20 DIAGNOSIS — N6323 Unspecified lump in the left breast, lower outer quadrant: Secondary | ICD-10-CM

## 2016-04-03 ENCOUNTER — Ambulatory Visit
Admission: RE | Admit: 2016-04-03 | Discharge: 2016-04-03 | Disposition: A | Discharge status: Home / Self Care | Payer: Medicaid Other | Source: Ambulatory Visit | Attending: Family Medicine | Admitting: Family Medicine

## 2016-04-03 DIAGNOSIS — Z1239 Encounter for other screening for malignant neoplasm of breast: Secondary | ICD-10-CM

## 2016-04-03 DIAGNOSIS — N6323 Unspecified lump in the left breast, lower outer quadrant: Secondary | ICD-10-CM

## 2016-04-07 ENCOUNTER — Other Ambulatory Visit: Payer: Self-pay | Admitting: Family Medicine

## 2016-04-07 DIAGNOSIS — R928 Other abnormal and inconclusive findings on diagnostic imaging of breast: Secondary | ICD-10-CM

## 2016-04-07 DIAGNOSIS — N6320 Unspecified lump in the left breast, unspecified quadrant: Secondary | ICD-10-CM

## 2016-04-14 ENCOUNTER — Ambulatory Visit
Admission: RE | Admit: 2016-04-14 | Discharge: 2016-04-14 | Disposition: A | Discharge status: Home / Self Care | Payer: Medicaid Other | Source: Ambulatory Visit | Attending: Family Medicine | Admitting: Family Medicine

## 2016-04-14 ENCOUNTER — Other Ambulatory Visit: Payer: Self-pay | Admitting: Family Medicine

## 2016-04-14 DIAGNOSIS — N6320 Unspecified lump in the left breast, unspecified quadrant: Secondary | ICD-10-CM

## 2016-04-14 DIAGNOSIS — R590 Localized enlarged lymph nodes: Secondary | ICD-10-CM | POA: Diagnosis present

## 2016-04-14 DIAGNOSIS — N6022 Fibroadenosis of left breast: Secondary | ICD-10-CM | POA: Diagnosis not present

## 2016-04-14 DIAGNOSIS — R928 Other abnormal and inconclusive findings on diagnostic imaging of breast: Secondary | ICD-10-CM

## 2016-04-14 HISTORY — PX: BREAST BIOPSY: SHX20

## 2016-04-15 LAB — SURGICAL PATHOLOGY

## 2017-06-06 IMAGING — MG US BREAST BX W LOC DEV 1ST LESION IMG BX SPEC US GUIDE*L*
1 series · 8 of 8 positions shown · non-contrast
Comparison: Previous exam(s).

ADDENDUM:
Pathology of the left breast biopsy revealed LEFT BREAST, [DATE];
BIOPSY: FIBROADENOMATOUS CHANGE. This was found to be concordant by
Dr. Umana.

Recommendation:  Return to routine screening mammogram.
At the patient's request, pathology and recommendations were relayed
to the patient by phone. She stated she has done well following the
biopsy with no bleeding, bruising, or hematoma. Post biopsy
instructions were reviewed with the patient. All of her questions
were answered. She was encouraged to call the [HOSPITAL]
with any further questions or concerns.
Results and recommendations relayed by Joshjax, [place] on 04/15/16.
CLINICAL DATA: 40-year-old female with indeterminate 1.3 cm mass in
the upper outer left breast and mildly enlarged left axillary lymph
node. For tissue sampling of 1.3 cm left breast mass.
EXAM:
ULTRASOUND GUIDED LEFT BREAST CORE NEEDLE BIOPSY

[Series 1: MG view · 0.05mm/px · 8 of 43 slices shown]
[im 1/43]
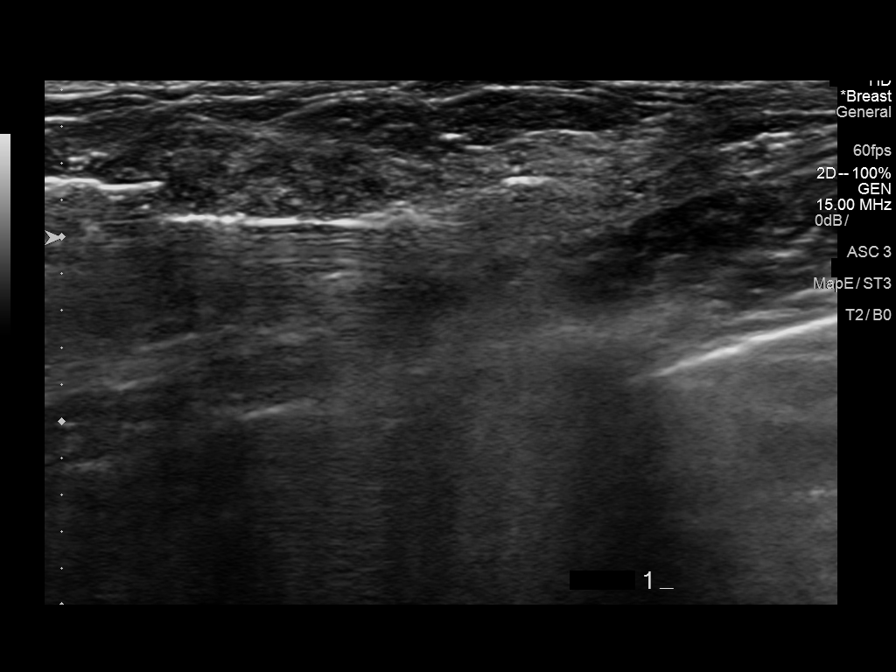
[im 7/43]
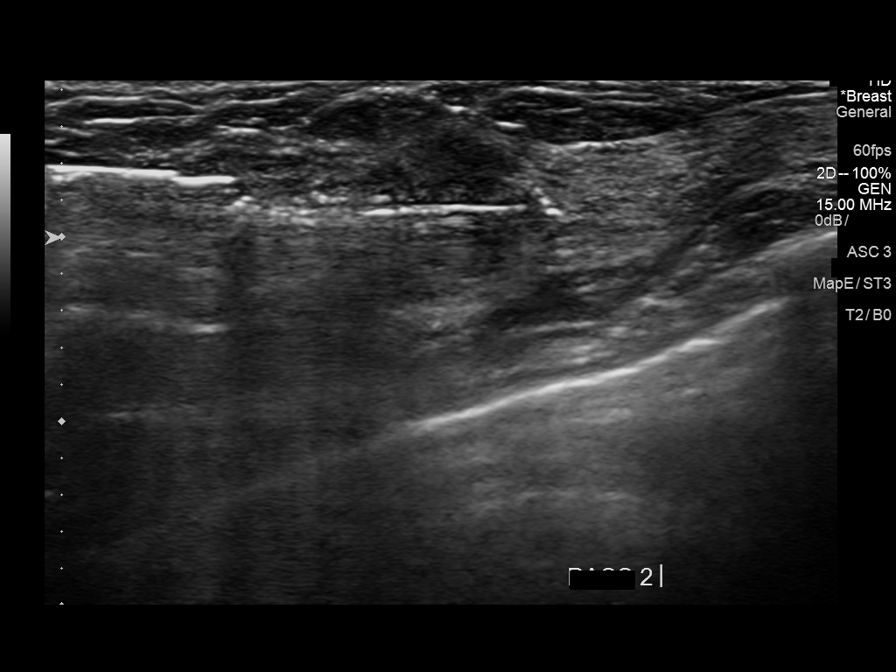
[im 13/43]
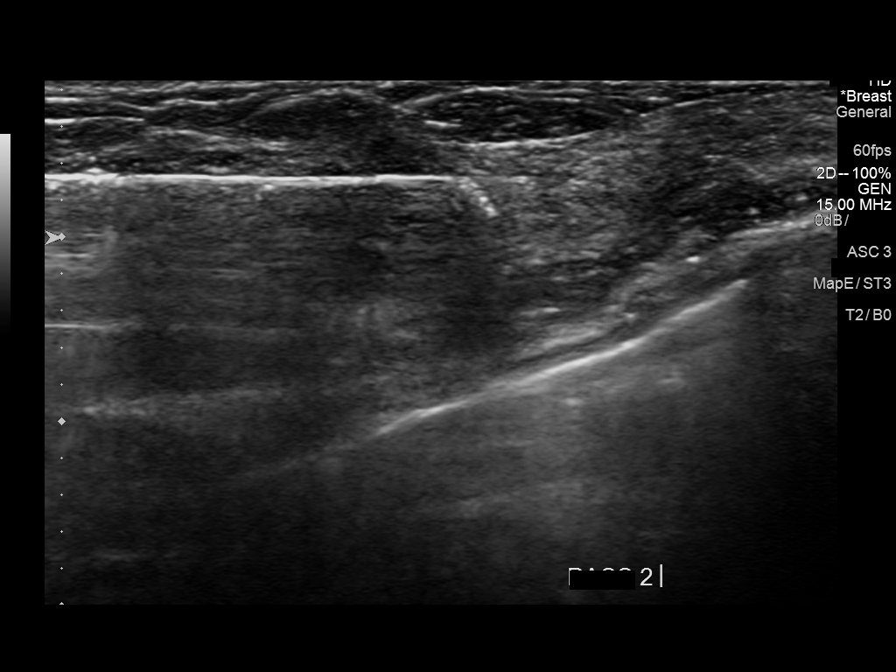
[im 19/43]
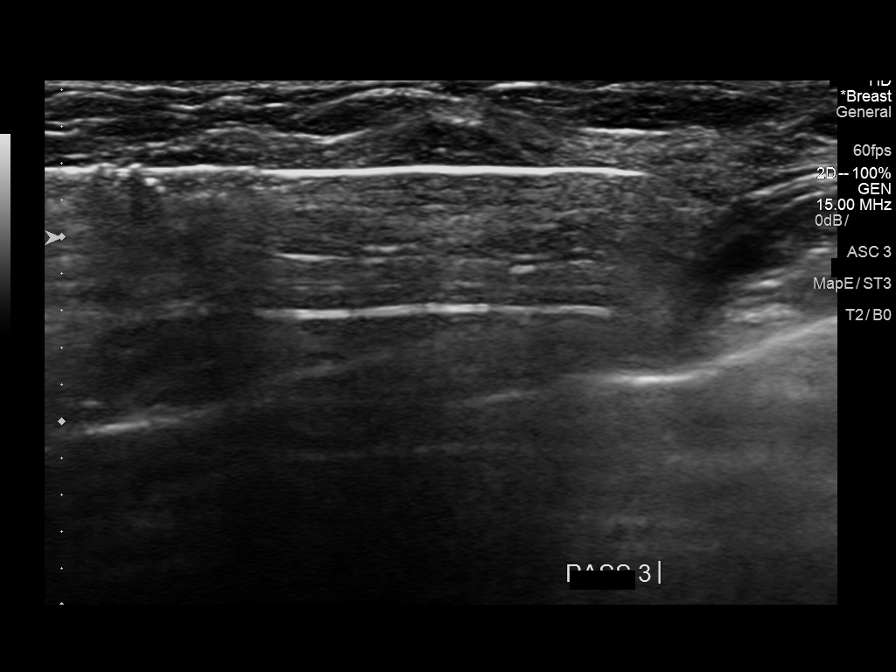
[im 25/43]
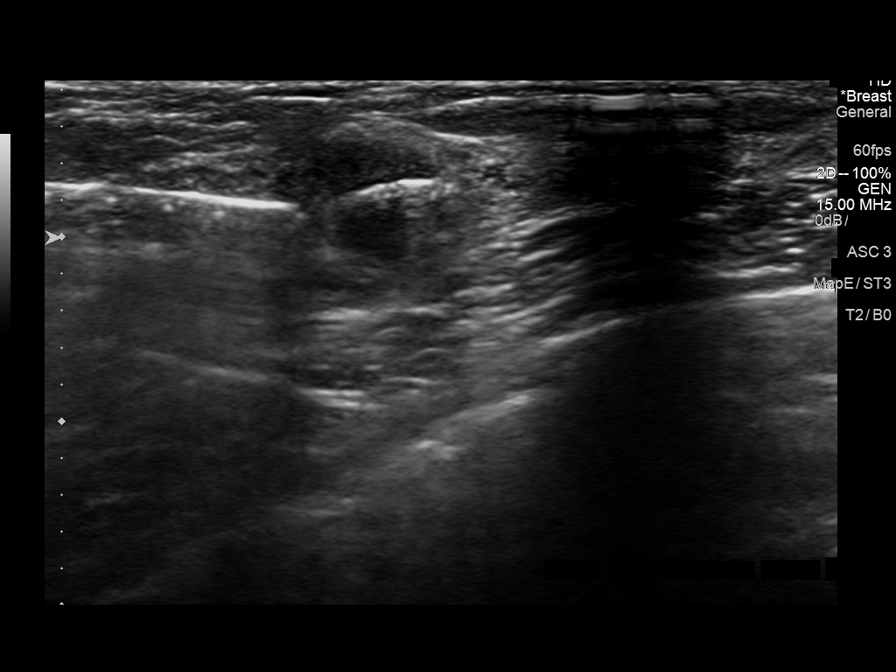
[im 31/43]
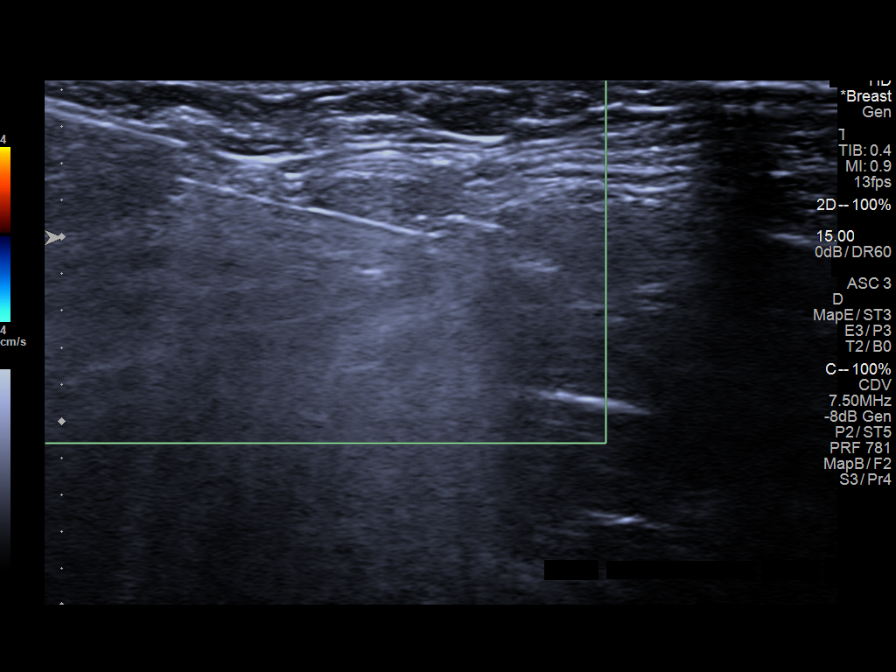
[im 37/43]
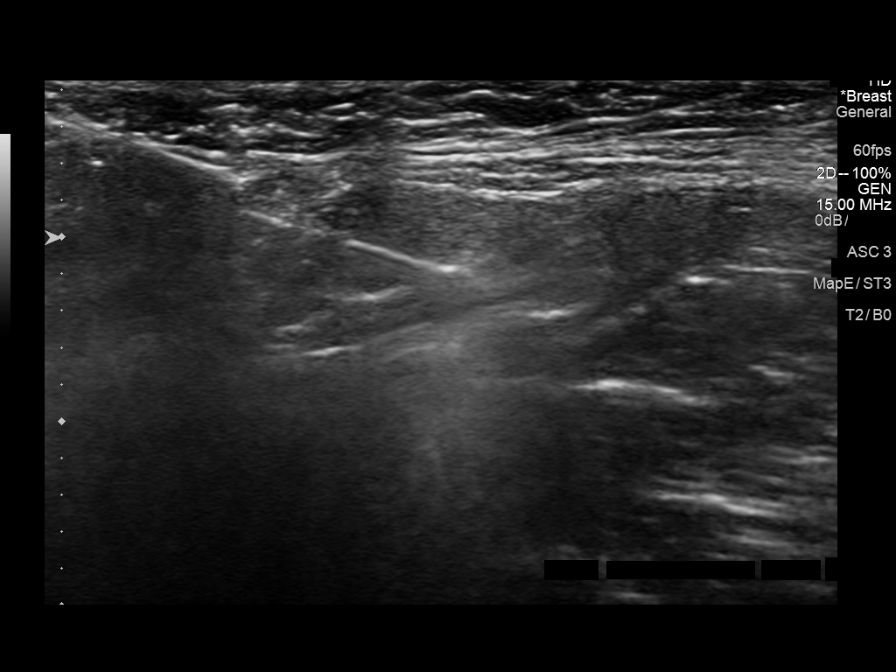
[im 43/43]
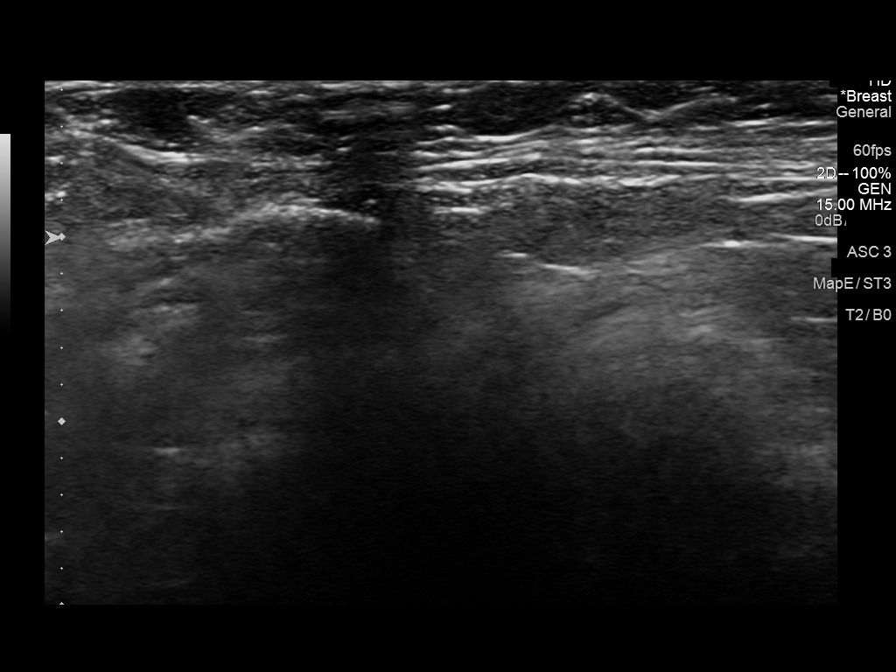

[8 of 8 positions shown; findings below may reference images not displayed]



Using sterile technique and 1% Lidocaine as local anesthetic, under
direct ultrasound visualization, a 12 gauge Raygon device was
used to perform biopsy of the 1.3 cm mass at the 2 o'clock position
of the left breast 3 cm from the nipple using a medial approach. At
the conclusion of the procedure a coil shaped tissue marker clip was
deployed into the biopsy cavity. Follow up 2 view mammogram was
performed and dictated separately.
IMPRESSION: Ultrasound guided biopsy of 1.3 cm upper-outer left breast mass. No
apparent complications.

Pathology will be followed.

## 2017-07-28 ENCOUNTER — Encounter (HOSPITAL_COMMUNITY): Payer: Self-pay

## 2017-07-28 ENCOUNTER — Inpatient Hospital Stay (HOSPITAL_COMMUNITY)
Admission: AD | Admit: 2017-07-28 | Discharge: 2017-07-28 | Disposition: A | Discharge status: Home / Self Care | Payer: Medicaid Other | Source: Ambulatory Visit | Attending: Obstetrics and Gynecology | Admitting: Obstetrics and Gynecology

## 2017-07-28 ENCOUNTER — Inpatient Hospital Stay (HOSPITAL_COMMUNITY): Payer: Medicaid Other

## 2017-07-28 DIAGNOSIS — O3680X0 Pregnancy with inconclusive fetal viability, not applicable or unspecified: Secondary | ICD-10-CM

## 2017-07-28 DIAGNOSIS — R109 Unspecified abdominal pain: Secondary | ICD-10-CM | POA: Insufficient documentation

## 2017-07-28 DIAGNOSIS — O09521 Supervision of elderly multigravida, first trimester: Secondary | ICD-10-CM

## 2017-07-28 DIAGNOSIS — Z3A01 Less than 8 weeks gestation of pregnancy: Secondary | ICD-10-CM | POA: Diagnosis not present

## 2017-07-28 DIAGNOSIS — Z886 Allergy status to analgesic agent status: Secondary | ICD-10-CM | POA: Diagnosis not present

## 2017-07-28 DIAGNOSIS — Z881 Allergy status to other antibiotic agents status: Secondary | ICD-10-CM | POA: Diagnosis not present

## 2017-07-28 DIAGNOSIS — Z3481 Encounter for supervision of other normal pregnancy, first trimester: Secondary | ICD-10-CM

## 2017-07-28 DIAGNOSIS — O283 Abnormal ultrasonic finding on antenatal screening of mother: Secondary | ICD-10-CM

## 2017-07-28 DIAGNOSIS — Z880 Allergy status to penicillin: Secondary | ICD-10-CM | POA: Insufficient documentation

## 2017-07-28 DIAGNOSIS — O99332 Smoking (tobacco) complicating pregnancy, second trimester: Secondary | ICD-10-CM | POA: Insufficient documentation

## 2017-07-28 DIAGNOSIS — O26891 Other specified pregnancy related conditions, first trimester: Secondary | ICD-10-CM | POA: Insufficient documentation

## 2017-07-28 DIAGNOSIS — F17210 Nicotine dependence, cigarettes, uncomplicated: Secondary | ICD-10-CM | POA: Insufficient documentation

## 2017-07-28 LAB — URINALYSIS, ROUTINE W REFLEX MICROSCOPIC
Bilirubin Urine: NEGATIVE
Glucose, UA: NEGATIVE mg/dL
HGB URINE DIPSTICK: NEGATIVE
Ketones, ur: NEGATIVE mg/dL
NITRITE: NEGATIVE
PROTEIN: NEGATIVE mg/dL
Specific Gravity, Urine: 1.021 (ref 1.005–1.030)
pH: 6 (ref 5.0–8.0)

## 2017-07-28 LAB — CBC WITH DIFFERENTIAL/PLATELET
BASOS ABS: 0 10*3/uL (ref 0.0–0.1)
BASOS PCT: 0 %
EOS PCT: 2 %
Eosinophils Absolute: 0.1 10*3/uL (ref 0.0–0.7)
HCT: 40.1 % (ref 36.0–46.0)
Hemoglobin: 13.3 g/dL (ref 12.0–15.0)
LYMPHS PCT: 26 %
Lymphs Abs: 1.5 10*3/uL (ref 0.7–4.0)
MCH: 30.4 pg (ref 26.0–34.0)
MCHC: 33.2 g/dL (ref 30.0–36.0)
MCV: 91.8 fL (ref 78.0–100.0)
MONO ABS: 0.4 10*3/uL (ref 0.1–1.0)
Monocytes Relative: 6 %
NEUTROS ABS: 3.8 10*3/uL (ref 1.7–7.7)
Neutrophils Relative %: 66 %
PLATELETS: 245 10*3/uL (ref 150–400)
RBC: 4.37 MIL/uL (ref 3.87–5.11)
RDW: 13 % (ref 11.5–15.5)
WBC: 5.8 10*3/uL (ref 4.0–10.5)

## 2017-07-28 LAB — WET PREP, GENITAL
Clue Cells Wet Prep HPF POC: NONE SEEN
Sperm: NONE SEEN
Trich, Wet Prep: NONE SEEN
Yeast Wet Prep HPF POC: NONE SEEN

## 2017-07-28 LAB — HCG, QUANTITATIVE, PREGNANCY: hCG, Beta Chain, Quant, S: 15943 m[IU]/mL — ABNORMAL HIGH (ref <5)

## 2017-07-28 LAB — TYPE AND SCREEN
ABO/RH(D): A POS
ANTIBODY SCREEN: NEGATIVE

## 2017-07-28 LAB — ABO/RH: ABO/RH(D): A POS

## 2017-07-28 LAB — POCT PREGNANCY, URINE: PREG TEST UR: POSITIVE — AB

## 2017-07-28 MED ORDER — PRENATAL VITAMIN 27-0.8 MG PO TABS: 1.0000 | ORAL_TABLET | Freq: Every morning | ORAL | 6 refills | Status: DC

## 2017-07-28 NOTE — MAU Provider Note (Signed)
History     CSN: [id number]  Arrival date and time: 07/28/17 1050   First Provider Initiated Contact with Patient 07/28/17 1109      No chief complaint on file.  HPI  Savannah Moore is a 42 y.o. G2P1011 who presents to MAU with pregnancy of unknown location and gestation in the setting of left lower quadrant pain that began 4-6 weeks ago.   Pregnancy New concern. Patient previously on continuous OCP and did not get a monthly period. Unknown LMP, sexually active. Reports extreme breast tenderness, denies other symptoms of pregnancy. Denies vaginal bleeding, leaking of fluid, fever, falls, or recent illness.  Reports she had a positive pregnancy test at the [facility] last week and was told to come to MAU to rule out ectopic pregnancy.  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 Lives with 60 year old son. Declines STI testing.   Patient states she was sent for ultrasound 07/23/2017 but "they didn't see any signs of pregnancy".  LLQ pain Existing issue. Pain is 5/10 and focused on lower left quadrant. Denies aggravating factors. Minor relief provided by PO Ibuprofen. Reports pain has improved over the past few days. Denies urinary symptoms.  OB History    Gravida  3   Para  1   Term  1   Preterm      AB  1   Living  1     SAB      TAB  1   Ectopic      Multiple      Live Births  1           Past Medical History:  Diagnosis Date  . Anxiety    uses Lorazepam on as needed basis   . Asthma    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  . Cancer ([place])    cervical dysplasia in the past- multiple procedures for this   . Chronic kidney disease    kidney stones, cystoscopy & also passed on her own   . Complication of anesthesia    agitated, confused, some nausea   . Family history of adverse reaction to anesthesia    Father very agitated when he waits & has nausea   . Headache    migraines, treated /w imitrex or aspirin  .  History of hiatal hernia    as a teenager , but states its resolved.   . Liver cyst    Pt. told that she has dense areas in her liver & there has been discussion about biopsying   . PONV (postoperative nausea and vomiting)     Past Surgical History:  Procedure Laterality Date  . BREAST BIOPSY Left 04/14/2016   u/s core path pend  . CHOLECYSTECTOMY N/A 04/02/2014   Procedure: LAPAROSCOPIC CHOLECYSTECTOMY;  Surgeon: Georganna Skeans, MD;  Location: [facility];  Service: General;  Laterality: N/A;  . DIAGNOSTIC LAPAROSCOPY     ovarian cyst  . UPPER GI ENDOSCOPY    . WISDOM TOOTH EXTRACTION      Family History  Problem Relation Age of Onset  . Breast cancer Neg Hx     Social History   Tobacco Use  . Smoking status: Current Every Day Smoker    Packs/day: 0.50  . Smokeless tobacco: Never Used  Substance Use Topics  . Alcohol use: Yes    Comment: rare use - 1/2 beer now & then   . Drug use: Yes    Types: Marijuana  Allergies:  Allergies  Allergen Reactions  . Ceclor [Cefaclor] Hives  . Flagyl [Metronidazole] Nausea And Vomiting    Oral tablet only  . Penicillins Hives    Has patient had a PCN reaction causing immediate rash, facial/tongue/throat swelling, SOB or lightheadedness with hypotension: Yes Has patient had a PCN reaction causing severe rash involving mucus membranes or skin necrosis: No Has patient had a PCN reaction that required hospitalization: No Has patient had a PCN reaction occurring within the last 10 years: No If all of the above answers are "NO", then may proceed with Cephalosporin use.   . Tylenol With Codeine #3 [Acetaminophen-Codeine] Other (See Comments)    Skin crawl    Medications Prior to Admission  Medication Sig Dispense Refill Last Dose  . albuterol (PROVENTIL HFA;VENTOLIN HFA) 108 (90 Base) MCG/ACT inhaler Inhale 1-2 puffs into the lungs every 6 (six) hours as needed for wheezing or shortness of breath.   prn  . Aspirin-Salicylamide-Caffeine  (BC HEADACHE) 325-95-16 MG TABS Take 1 Dose by mouth 2 (two) times daily as needed (headache).   Past Week at Unknown time  . ibuprofen (ADVIL,MOTRIN) 200 MG tablet Take 800 mg by mouth every 8 (eight) hours as needed for headache.   07/27/2017 at Unknown time    Review of Systems  Endocrine: Negative for polydipsia and polyphagia.  Genitourinary: Negative for dyspareunia, dysuria, flank pain, vaginal bleeding, vaginal discharge and vaginal pain.  Neurological: Negative for dizziness, light-headedness and headaches.  All other systems reviewed and are negative.  Physical Exam   Blood pressure 100/64, pulse 79, temperature (!) 97.5 F (36.4 C), temperature source Oral, resp. rate 16.  Physical Exam  Nursing note and vitals reviewed. Constitutional: She is oriented to person, place, and time. She appears well-developed and well-nourished.  Cardiovascular: Normal rate, regular rhythm, normal heart sounds and intact distal pulses.  Respiratory: Effort normal and breath sounds normal.  GI: Soft. Bowel sounds are normal. She exhibits no distension and no mass. There is no tenderness. There is no rebound and no guarding.  Musculoskeletal: Normal range of motion.  Neurological: She is alert and oriented to person, place, and time. She has normal reflexes.  Skin: Skin is warm and dry.  Psychiatric: She has a normal mood and affect. Her behavior is normal. Judgment and thought content normal.    MAU Course  Procedures  MDM Orders Placed This Encounter  Procedures  . Wet prep, genital    Standing Status:   Standing    Number of Occurrences:   1  . US OB LESS THAN 14 WEEKS WITH OB TRANSVAGINAL    Left lower quadrant pain x 4-6 weeks, positive pregnancy test last week, no bleeding    Standing Status:   Standing    Number of Occurrences:   1    Order Specific Question:   Symptom/Reason for Exam    Answer:   Pregnancy of unknown anatomic location [161096]  . Urinalysis, Routine w reflex  microscopic    Standing Status:   Standing    Number of Occurrences:   1  . CBC with Differential/Platelet    Standing Status:   Standing    Number of Occurrences:   1  . hCG, quantitative, pregnancy    Standing Status:   Standing    Number of Occurrences:   1  . Pregnancy, urine POC    Standing Status:   Standing    Number of Occurrences:   1  . Type and screen  [place]     Standing Status:   Standing    Number of Occurrences:   1  . ABO/Rh    Standing Status:   Standing    Number of Occurrences:   1  . Discharge patient    Order Specific Question:   Discharge disposition    Answer:   01-Home or Self Care [1]    Order Specific Question:   Discharge patient date    Answer:   07/28/2017   Results for orders placed or performed during the hospital encounter of 07/28/17 (from the past 24 hour(s))  Urinalysis, Routine w reflex microscopic     Status: Abnormal   Collection Time: 07/28/17 11:47 AM  Result Value Ref Range   Color, Urine YELLOW YELLOW   APPearance CLOUDY (A) CLEAR   Specific Gravity, Urine 1.021 1.005 - 1.030   pH 6.0 5.0 - 8.0   Glucose, UA NEGATIVE NEGATIVE mg/dL   Hgb urine dipstick NEGATIVE NEGATIVE   Bilirubin Urine NEGATIVE NEGATIVE   Ketones, ur NEGATIVE NEGATIVE mg/dL   Protein, ur NEGATIVE NEGATIVE mg/dL   Nitrite NEGATIVE NEGATIVE   Leukocytes, UA SMALL (A) NEGATIVE   RBC / HPF 0-5 0 - 5 RBC/hpf   WBC, UA 11-20 0 - 5 WBC/hpf   Bacteria, UA FEW (A) NONE SEEN   Squamous Epithelial / LPF 21-50 0 - 5   Mucus PRESENT   Pregnancy, urine POC     Status: Abnormal   Collection Time: 07/28/17 11:49 AM  Result Value Ref Range   Preg Test, Ur POSITIVE (A) NEGATIVE  CBC with Differential/Platelet     Status: None   Collection Time: 07/28/17 11:56 AM  Result Value Ref Range   WBC 5.8 4.0 - 10.5 K/uL   RBC 4.37 3.87 - 5.11 MIL/uL   Hemoglobin 13.3 12.0 - 15.0 g/dL   HCT 40.1 36.0 - 46.0 %   MCV 91.8  78.0 - 100.0 fL   MCH 30.4 26.0 - 34.0 pg   MCHC 33.2 30.0 - 36.0 g/dL   RDW 13.0 11.5 - 15.5 %   Platelets 245 150 - 400 K/uL   Neutrophils Relative % 66 %   Neutro Abs 3.8 1.7 - 7.7 K/uL   Lymphocytes Relative 26 %   Lymphs Abs 1.5 0.7 - 4.0 K/uL   Monocytes Relative 6 %   Monocytes Absolute 0.4 0.1 - 1.0 K/uL   Eosinophils Relative 2 %   Eosinophils Absolute 0.1 0.0 - 0.7 K/uL   Basophils Relative 0 %   Basophils Absolute 0.0 0.0 - 0.1 K/uL  Type and screen [place]     Status: None   Collection Time: 07/28/17 11:56 AM  Result Value Ref Range   ABO/RH(D) A POS    Antibody Screen NEG    Sample Expiration      07/31/2017 Performed at [facility], [address] [place], [place] [address]   hCG, quantitative, pregnancy     Status: Abnormal   Collection Time: 07/28/17 11:56 AM  Result Value Ref Range   hCG, Beta Chain, Quant, S 15,943 (H) <5 mIU/mL  Wet prep, genital     Status: Abnormal   Collection Time: 07/28/17 11:57 AM  Result Value Ref Range   Yeast Wet Prep HPF POC NONE SEEN NONE SEEN   Trich, Wet Prep NONE SEEN NONE SEEN   Clue Cells Wet Prep HPF POC NONE SEEN NONE SEEN  WBC, Wet Prep HPF POC FEW (A) NONE SEEN   Sperm NONE SEEN     US Ob Less Than 14 Weeks With Ob Transvaginal  Result Date: 07/28/2017 CLINICAL DATA:  LEFT LOWER QUADRANT pain for 4-6 weeks. LMP is unknown. Quantitative beta HCG is 15, 943. Gravida 3 para 1 T AB1. Positive urine pregnancy test last week. EXAM: OBSTETRIC <14 WK [place] AND TRANSVAGINAL OB US TECHNIQUE: Both transabdominal and transvaginal ultrasound examinations were performed for complete evaluation of the gestation as well as the maternal uterus, adnexal regions, and pelvic cul-de-sac. Transvaginal technique was performed to assess early pregnancy. COMPARISON:  CT of the abdomen and pelvis on 12/14/2013 FINDINGS: Intrauterine gestational sac: Single Yolk sac:  Visualized. Embryo:  Visualized. Cardiac Activity:  Visualized. Heart Rate: 103 bpm CRL:  6.59 mm   6 w   3 d                  [place] EDC: 03/20/2018 Subchorionic hemorrhage:  None visualized. Maternal uterus/adnexae: RIGHT corpus luteum cyst is present. LEFT ovary is normal in appearance. Small amount of free pelvic fluid. IMPRESSION: 1. Single living intrauterine embryo measuring 6 weeks 3 days. By today's exam EDC is 03/20/2018. 2. No subchorionic hemorrhage. Electronically Signed   By: Nolon Nations M.D.   On: 07/28/2017 13:48    Assessment and Plan  --42 y.o. G3P1011 with [facility] at [redacted]w[redacted]d by US performed today --Patient unsure if she will continue pregnancy, amenable to discussion about prenatal care and safe meds (AVS) --Patient to pursue [facility] provider PRN --Discharge home in stable condition  Darlina Rumpf, CNM 07/28/2017, 1:57 PM

## 2017-07-28 NOTE — Discharge Instructions (Signed)
First Trimester of Pregnancy The first trimester of pregnancy is from week 1 until the end of week 13 (months 1 through 3). During this time, your baby will begin to develop inside you. At 6-8 weeks, the eyes and face are formed, and the heartbeat can be seen on ultrasound. At the end of 12 weeks, all the baby's organs are formed. Prenatal care is all the medical care you receive before the birth of your baby. Make sure you get good prenatal care and follow all of your doctor's instructions. Follow these instructions at home: Medicines  Take over-the-counter and prescription medicines only as told by your doctor. Some medicines are safe and some medicines are not safe during pregnancy.  Take a prenatal vitamin that contains at least 600 micrograms (mcg) of folic acid.  If you have trouble pooping (constipation), take medicine that will make your stool soft (stool softener) if your doctor approves. Eating and drinking  Eat regular, healthy meals.  Your doctor will tell you the amount of weight gain that is right for you.  Avoid raw meat and uncooked cheese.  If you feel sick to your stomach (nauseous) or throw up (vomit): ? Eat 4 or 5 small meals a day instead of 3 large meals. ? Try eating a few soda crackers. ? Drink liquids between meals instead of during meals.  To prevent constipation: ? Eat foods that are high in fiber, like fresh fruits and vegetables, whole grains, and beans. ? Drink enough fluids to keep your pee (urine) clear or pale yellow. Activity  Exercise only as told by your doctor. Stop exercising if you have cramps or pain in your lower belly (abdomen) or low back.  Do not exercise if it is too hot, too humid, or if you are in a place of great height (high altitude).  Try to avoid standing for long periods of time. Move your legs often if you must stand in one place for a long time.  Avoid heavy lifting.  Wear low-heeled shoes. Sit and stand up straight.  You  can have sex unless your doctor tells you not to. Relieving pain and discomfort  Wear a good support bra if your breasts are sore.  Take warm water baths (sitz baths) to soothe pain or discomfort caused by hemorrhoids. Use hemorrhoid cream if your doctor says it is okay.  Rest with your legs raised if you have leg cramps or low back pain.  If you have puffy, bulging veins (varicose veins) in your legs: ? Wear support hose or compression stockings as told by your doctor. ? Raise (elevate) your feet for 15 minutes, 3-4 times a day. ? Limit salt in your food. Prenatal care  Schedule your prenatal visits by the twelfth week of pregnancy.  Write down your questions. Take them to your prenatal visits.  Keep all your prenatal visits as told by your doctor. This is important. Safety  Wear your seat belt at all times when driving.  Make a list of emergency phone numbers. The list should include numbers for family, friends, the hospital, and police and fire departments. General instructions  Ask your doctor for a referral to a local prenatal class. Begin classes no later than at the start of month 6 of your pregnancy.  Ask for help if you need counseling or if you need help with nutrition. Your doctor can give you advice or tell you where to go for help.  Do not use hot tubs, steam rooms, or   saunas.  Do not douche or use tampons or scented sanitary pads.  Do not cross your legs for long periods of time.  Avoid all herbs and alcohol. Avoid drugs that are not approved by your doctor.  Do not use any tobacco products, including cigarettes, chewing tobacco, and electronic cigarettes. If you need help quitting, ask your doctor. You may get counseling or other support to help you quit.  Avoid cat litter boxes and soil used by cats. These carry germs that can cause birth defects in the baby and can cause a loss of your baby (miscarriage) or stillbirth.  Visit your dentist. At home, brush  your teeth with a soft toothbrush. Be gentle when you floss. Contact a doctor if:  You are dizzy.  You have mild cramps or pressure in your lower belly.  You have a nagging pain in your belly area.  You continue to feel sick to your stomach, you throw up, or you have watery poop (diarrhea).  You have a bad smelling fluid coming from your vagina.  You have pain when you pee (urinate).  You have increased puffiness (swelling) in your face, hands, legs, or ankles. Get help right away if:  You have a fever.  You are leaking fluid from your vagina.  You have spotting or bleeding from your vagina.  You have very bad belly cramping or pain.  You gain or lose weight rapidly.  You throw up blood. It may look like coffee grounds.  You are around people who have German measles, fifth disease, or chickenpox.  You have a very bad headache.  You have shortness of breath.  You have any kind of trauma, such as from a fall or a car accident. Summary  The first trimester of pregnancy is from week 1 until the end of week 13 (months 1 through 3).  To take care of yourself and your unborn baby, you will need to eat healthy meals, take medicines only if your doctor tells you to do so, and do activities that are safe for you and your baby.  Keep all follow-up visits as told by your doctor. This is important as your doctor will have to ensure that your baby is healthy and growing well. This information is not intended to replace advice given to you by your health care provider. Make sure you discuss any questions you have with your health care provider. Document Released: 07/08/2007 Document Revised: 01/28/2016 Document Reviewed: 01/28/2016 Elsevier Interactive Patient Education  2017 [employer]  

## 2017-07-28 NOTE — MAU Note (Signed)
Pt was evaluated at the pregnancy care center and was advised to come to MAU to rule out ectopic. Having pain for a month. No bleeding

## 2017-07-29 LAB — GC/CHLAMYDIA PROBE AMP (~~LOC~~) NOT AT ARMC
CHLAMYDIA, DNA PROBE: NEGATIVE
Neisseria Gonorrhea: NEGATIVE

## 2017-08-21 ENCOUNTER — Other Ambulatory Visit: Payer: Self-pay

## 2017-08-21 DIAGNOSIS — Z5321 Procedure and treatment not carried out due to patient leaving prior to being seen by health care provider: Secondary | ICD-10-CM | POA: Insufficient documentation

## 2017-08-21 DIAGNOSIS — R109 Unspecified abdominal pain: Secondary | ICD-10-CM | POA: Insufficient documentation

## 2017-08-21 LAB — CBC WITH DIFFERENTIAL/PLATELET
BASOS PCT: 1 %
Basophils Absolute: 0.1 10*3/uL (ref 0–0.1)
Eosinophils Absolute: 0.1 10*3/uL (ref 0–0.7)
Eosinophils Relative: 2 %
HEMATOCRIT: 37.8 % (ref 35.0–47.0)
HEMOGLOBIN: 13.3 g/dL (ref 12.0–16.0)
Lymphocytes Relative: 25 %
Lymphs Abs: 2.2 10*3/uL (ref 1.0–3.6)
MCH: 31.4 pg (ref 26.0–34.0)
MCHC: 35.3 g/dL (ref 32.0–36.0)
MCV: 89 fL (ref 80.0–100.0)
MONO ABS: 0.6 10*3/uL (ref 0.2–0.9)
Monocytes Relative: 7 %
NEUTROS ABS: 5.8 10*3/uL (ref 1.4–6.5)
NEUTROS PCT: 65 %
Platelets: 303 10*3/uL (ref 150–440)
RBC: 4.25 MIL/uL (ref 3.80–5.20)
RDW: 12.8 % (ref 11.5–14.5)
WBC: 8.9 10*3/uL (ref 3.6–11.0)

## 2017-08-21 LAB — URINALYSIS, ROUTINE W REFLEX MICROSCOPIC
Bilirubin Urine: NEGATIVE
Glucose, UA: NEGATIVE mg/dL
KETONES UR: NEGATIVE mg/dL
Nitrite: NEGATIVE
PROTEIN: 30 mg/dL — AB
RBC / HPF: >50 RBC/hpf — ABNORMAL HIGH (ref 0–5)
Specific Gravity, Urine: 1.023 (ref 1.005–1.030)
pH: 7 (ref 5.0–8.0)

## 2017-08-21 LAB — HCG, QUANTITATIVE, PREGNANCY: hCG, Beta Chain, Quant, S: 5272 m[IU]/mL — ABNORMAL HIGH (ref <5)

## 2017-08-21 NOTE — ED Triage Notes (Addendum)
Reports lower abdominal pain off/on all day worse this evening.  Reports nausea.  Reports dark vaginal bleeding for couple days.  Reports history of ovarian cyst.

## 2017-08-22 ENCOUNTER — Emergency Department
Admission: EM | Admit: 2017-08-22 | Discharge: 2017-08-22 | Discharge status: Against Medical Advice | Payer: Medicaid Other | Attending: Emergency Medicine | Admitting: Emergency Medicine

## 2017-08-23 ENCOUNTER — Telehealth: Payer: Self-pay | Admitting: Emergency Medicine

## 2017-08-23 NOTE — Telephone Encounter (Signed)
Called patient due to lwot to inquire about condition and follow up plans. Left message.   

## 2018-06-12 ENCOUNTER — Encounter (HOSPITAL_COMMUNITY): Payer: Self-pay

## 2019-05-14 IMAGING — US US OB < 14 WEEKS - US OB TV
1 series · 15 of 28 positions shown · non-contrast
Comparison: CT of the abdomen and pelvis on 12/14/2013

CLINICAL DATA: LEFT LOWER QUADRANT pain for 4-6 weeks. LMP is
unknown. Quantitative beta HCG is 15, 943. Gravida 3 para 1 T AB1.
Positive urine pregnancy test last week.

EXAM:
OBSTETRIC <14 WK US AND TRANSVAGINAL OB US
TECHNIQUE: Both transabdominal and transvaginal ultrasound examinations were
performed for complete evaluation of the gestation as well as the
maternal uterus, adnexal regions, and pelvic cul-de-sac.
Transvaginal technique was performed to assess early pregnancy.

[Series 1: us ob < 14 weeks - us ob tv · 15 of 37 slices shown]
[im 1/37]
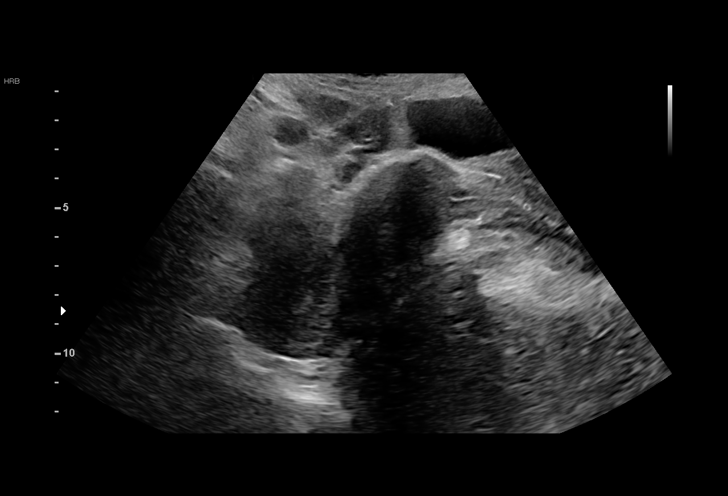
[im 3/37]
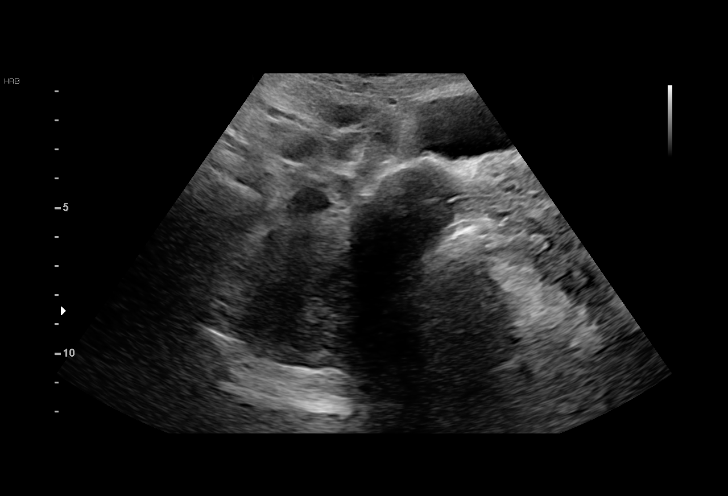
[im 6/37]
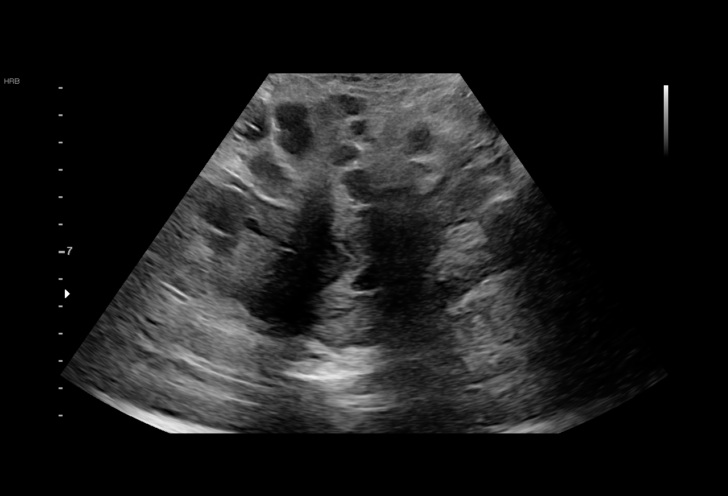
[im 9/37]
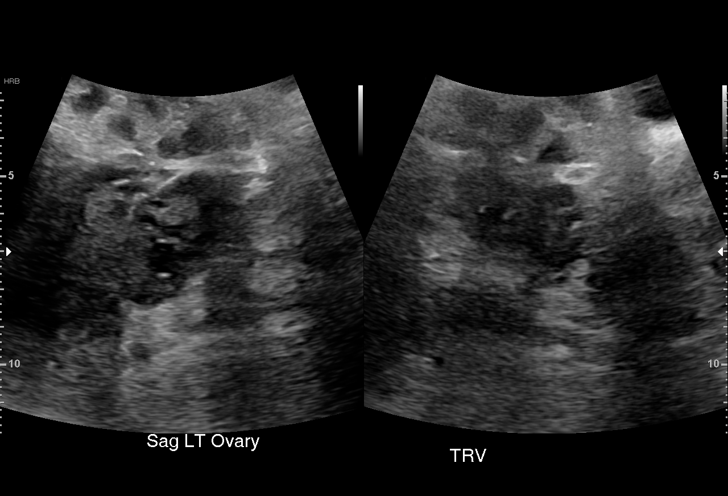
[im 11/37]
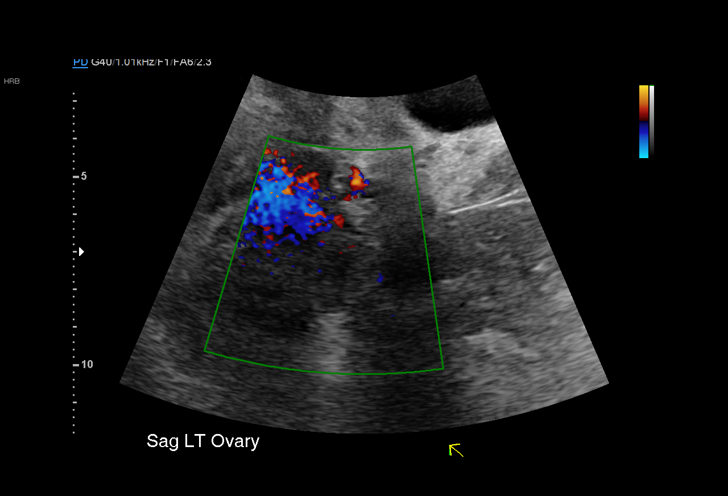
[im 14/37]
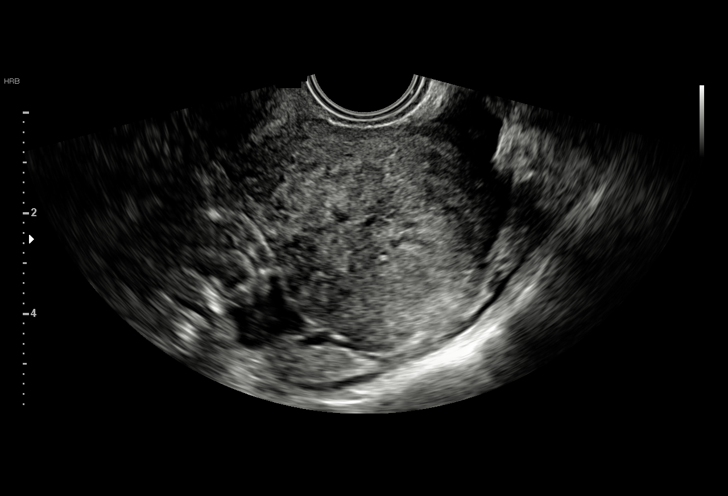
[im 17/37]
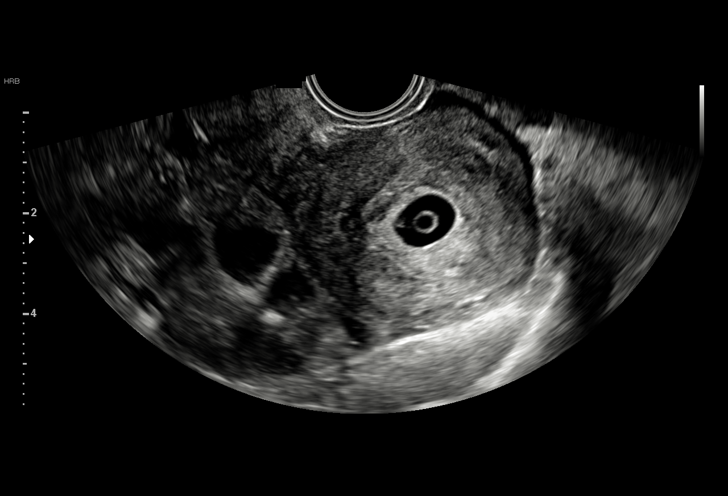
[im 19/37]
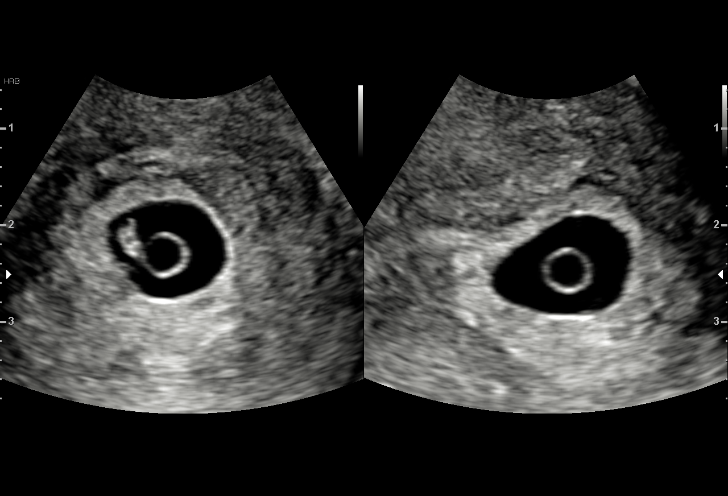
[im 21/37]
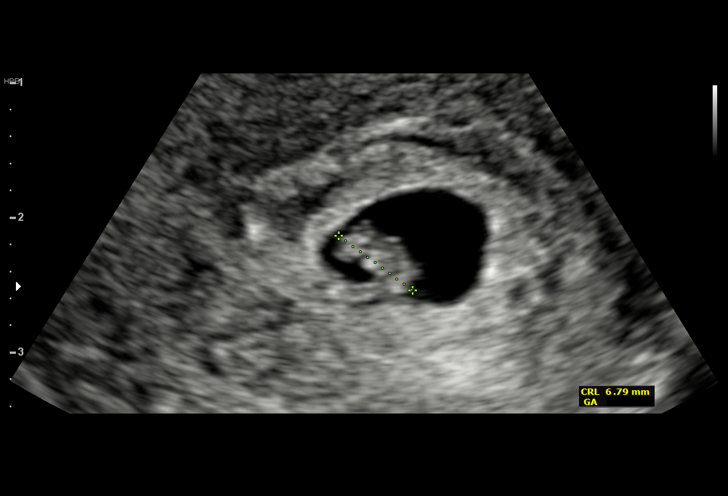
[im 23/37]
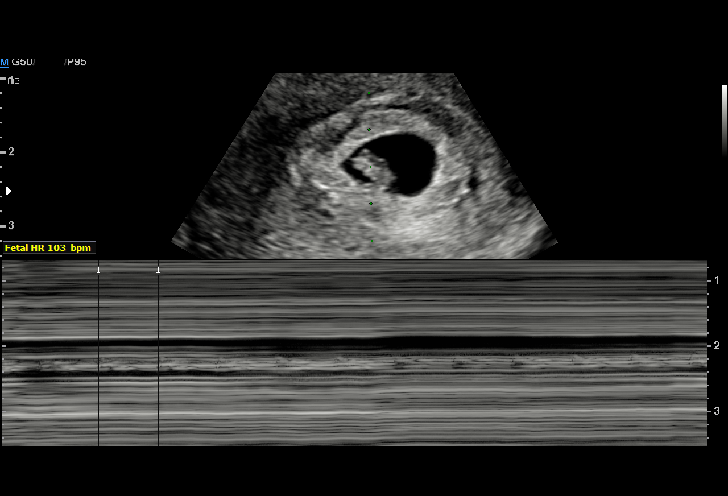
[im 26/37]
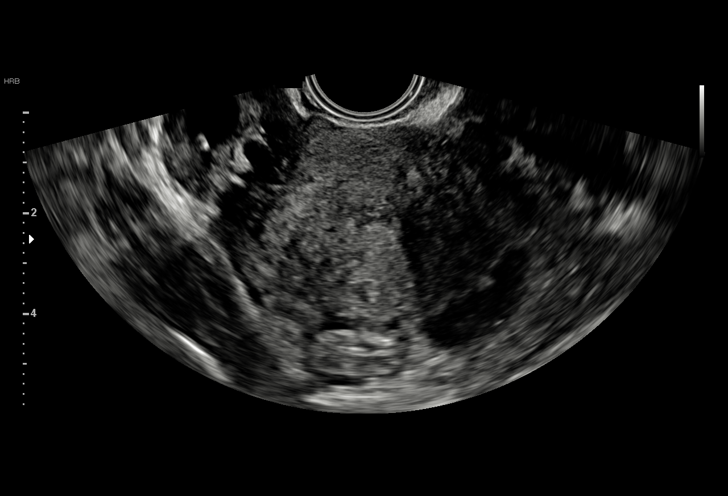
[im 29/37]
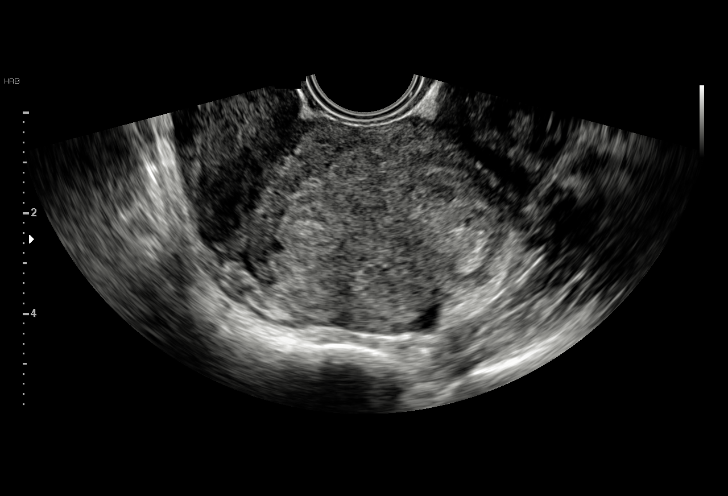
[im 31/37]
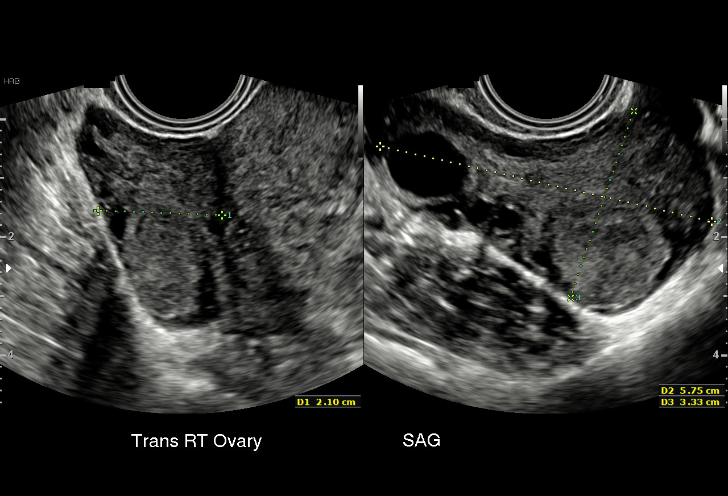
[im 34/37]
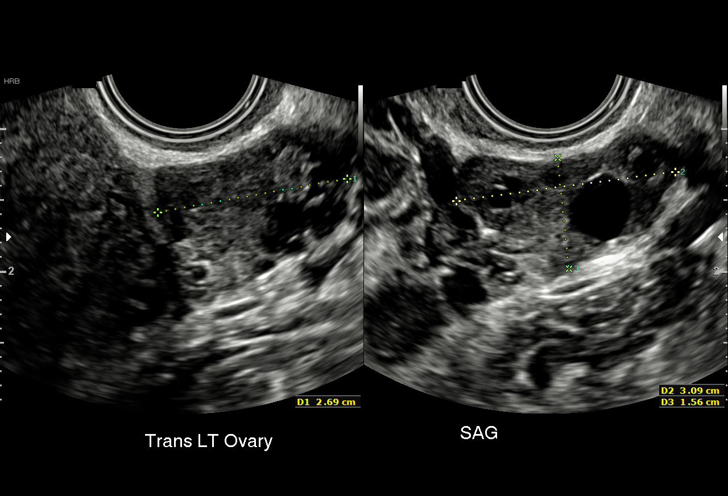
[im 37/37]
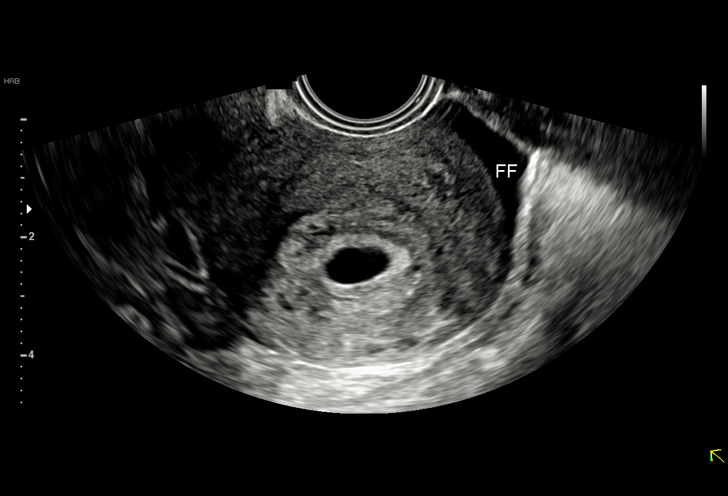

[15 of 28 positions shown; findings below may reference images not displayed]

FINDINGS: Intrauterine gestational sac: Single

Yolk sac:  Visualized.

Embryo:  Visualized.

Cardiac Activity: Visualized.

Heart Rate: 103 bpm

CRL:  6.59 mm   6 w   3 d                  US EDC: 03/20/2018

Subchorionic hemorrhage:  None visualized.

Maternal uterus/adnexae: RIGHT corpus luteum cyst is present. LEFT
ovary is normal in appearance. Small amount of free pelvic fluid.
IMPRESSION: 1. Single living intrauterine embryo measuring 6 weeks 3 days. By
today's exam EDC is 03/20/2018.
2. No subchorionic hemorrhage.

## 2019-11-16 ENCOUNTER — Ambulatory Visit: Payer: Self-pay

## 2019-11-23 ENCOUNTER — Ambulatory Visit: Payer: Medicaid Other

## 2019-11-24 ENCOUNTER — Ambulatory Visit: Payer: Medicaid Other

## 2019-12-04 ENCOUNTER — Other Ambulatory Visit: Payer: Self-pay

## 2019-12-04 ENCOUNTER — Ambulatory Visit: Payer: Medicaid Other

## 2019-12-04 ENCOUNTER — Ambulatory Visit: Payer: Medicaid Other | Admitting: Family Medicine

## 2019-12-04 ENCOUNTER — Encounter: Payer: Self-pay | Admitting: Family Medicine

## 2019-12-04 DIAGNOSIS — N760 Acute vaginitis: Secondary | ICD-10-CM

## 2019-12-04 DIAGNOSIS — F32A Depression, unspecified: Secondary | ICD-10-CM

## 2019-12-04 DIAGNOSIS — B9689 Other specified bacterial agents as the cause of diseases classified elsewhere: Secondary | ICD-10-CM

## 2019-12-04 DIAGNOSIS — Z113 Encounter for screening for infections with a predominantly sexual mode of transmission: Secondary | ICD-10-CM

## 2019-12-04 LAB — WET PREP FOR TRICH, YEAST, CLUE
Trichomonas Exam: NEGATIVE
Yeast Exam: NEGATIVE

## 2019-12-04 MED ORDER — METRONIDAZOLE 0.75 % EX GEL: 1.0000 "application " | Freq: Every day | CUTANEOUS | 0 refills | Status: AC

## 2019-12-04 NOTE — Progress Notes (Signed)
[facility] Department STI clinic/screening visit  Subjective:  Savannah Moore is a 44 y.o. female being seen today for an STI screening visit. The patient reports they do have symptoms.  Patient reports that they do not desire a pregnancy in the next year.   They reported they are not interested in discussing contraception today.  Patient's last menstrual period was 11/20/2019 (exact date).   Patient has the following medical conditions:  There are no problems to display for this patient.   Chief Complaint  Patient presents with  . SEXUALLY TRANSMITTED DISEASE    STD screening including bloodwork    HPI  Patient reports having s/sx of pain and spotting with sex, intermittent discharge, patient could not specify time frame, and odor x ~ 1 week.  Patient denies partner with symptoms.   Last HIV test per patient was a "few years ago"  Patient reports last pap was 2016   See flowsheet for further details and programmatic requirements.    The following portions of the patient's history were reviewed and updated as appropriate: allergies, current medications, past medical history, past social history, past surgical history and problem list.  Objective:  There were no vitals filed for this visit.  Physical Exam Constitutional:      Appearance: Normal appearance.  HENT:     Head: Normocephalic.     Comments: In scalp, brows and lashes: no nits, no hair loss     Mouth/Throat:     Mouth: Mucous membranes are moist.     Pharynx: Oropharynx is clear. No oropharyngeal exudate or posterior oropharyngeal erythema.  Neck:     Comments: R palpable superficial cervical, movable, non tender or painful.   Abdominal:     General: Abdomen is flat.     Palpations: Abdomen is soft. There is no mass.     Tenderness: There is no abdominal tenderness. There is no guarding.  Genitourinary:    Comments: External genitalia without, lice, nits, erythema, edema , lesions or inguinal  adenopathy. Vagina with normal mucosa and bownish/yellowish discharge, odor,  and pH > 4.  Cervix without visual lesions, removal of cervical tissue near os, uterus firm, mobile, non-tender, no masses, CMT adnexal fullness or tenderness.  Musculoskeletal:     Cervical back: Normal range of motion.  Lymphadenopathy:     Cervical: Cervical adenopathy present.  Skin:    General: Skin is warm and dry.     Findings: No bruising, erythema, lesion or rash.  Neurological:     Mental Status: She is alert.  Psychiatric:        Mood and Affect: Mood normal.      Assessment and Plan:  Savannah Moore is a 44 y.o. female presenting to the [facility] Department for STI screening  1. Screening examination for venereal disease - WET PREP FOR [place], YEAST, CLUE - Chlamydia/Gonorrhea [place] [facility] - Syphilis Serology, [place] [facility] - HIV/HCV [place] [facility]  Discussed time line for [facility] Lab results and that patient will be called with positive results and encouraged patient to call if she had not heard in 2 weeks.  Counseled to return or seek care for continued or worsening symptoms Recommended condom use with all sex  Patient is currently using no birth control  to prevent pregnancy.   Patient accepted all screenings including oral & vaginal CT/GC and bloodwork for HIV/HCV and RPR.  Patient meets criteria for HepB screening? Yes. Ordered? No -  recently had blood work for school Patient meets criteria for HepC screening? Yes. Ordered? Yes   2. Bacterial vaginosis  Per wet prep results and PE, patient needs treatment for BV.  D/T  Patient intolerance of oral meds treat with  - metroNIDAZOLE (METROGEL) 0.75 % gel; Apply 1 application topically at bedtime for 5 days.  Dispense: 70 g; Refill: 0  Discussed with patient Steps to prevent BV and yeast: Wear all-cotton underwear Sleep without underwear Take showers instead of baths Wear loose fitting  clothing, especially during warm/hot weather Use a hair dryer on low after bathing to dry the area Avoid scented soaps and body washes Do not douche May try over the counter probiotics or boric acid gel or suppositories Stop smoking .  3. Depression, unspecified depression type  Patient reports having depression d/t custody issues with son and son not living at home.  Denies thoughts or plans of harm to self or others.   - Ambulatory referral to [facility] sent   Return if symptoms worsen or fail to improve.  No future appointments. Patient to call if any problems or concerns.    Junious Dresser, FNP

## 2019-12-27 ENCOUNTER — Telehealth: Payer: Self-pay | Admitting: Family Medicine

## 2019-12-27 NOTE — Telephone Encounter (Signed)
Wants results and don't want to come in.

## 2019-12-27 NOTE — Telephone Encounter (Signed)
Patient called for TR from 12/04/2019. RN reviewed results with patient. All questions answered.   Deri Fuelling, RN

## 2021-05-24 ENCOUNTER — Emergency Department
Admission: EM | Admit: 2021-05-24 | Discharge: 2021-05-25 | Disposition: A | Discharge status: Home / Self Care | Payer: Medicaid Other | Attending: Emergency Medicine | Admitting: Emergency Medicine

## 2021-05-24 ENCOUNTER — Encounter: Payer: Self-pay | Admitting: Emergency Medicine

## 2021-05-24 ENCOUNTER — Other Ambulatory Visit: Payer: Self-pay

## 2021-05-24 ENCOUNTER — Emergency Department: Payer: Medicaid Other

## 2021-05-24 DIAGNOSIS — Z8541 Personal history of malignant neoplasm of cervix uteri: Secondary | ICD-10-CM | POA: Diagnosis not present

## 2021-05-24 DIAGNOSIS — J45909 Unspecified asthma, uncomplicated: Secondary | ICD-10-CM | POA: Insufficient documentation

## 2021-05-24 DIAGNOSIS — N189 Chronic kidney disease, unspecified: Secondary | ICD-10-CM | POA: Insufficient documentation

## 2021-05-24 DIAGNOSIS — N201 Calculus of ureter: Secondary | ICD-10-CM | POA: Insufficient documentation

## 2021-05-24 DIAGNOSIS — R109 Unspecified abdominal pain: Secondary | ICD-10-CM | POA: Diagnosis present

## 2021-05-24 LAB — BASIC METABOLIC PANEL
Anion gap: 9 (ref 5–15)
BUN: 20 mg/dL (ref 6–20)
CO2: 30 mmol/L (ref 22–32)
Calcium: 9.4 mg/dL (ref 8.9–10.3)
Chloride: 103 mmol/L (ref 98–111)
Creatinine, Ser: 0.85 mg/dL (ref 0.44–1.00)
GFR, Estimated: >60 mL/min (ref >60)
Glucose, Bld: 115 mg/dL — ABNORMAL HIGH (ref 70–99)
Potassium: 3.8 mmol/L (ref 3.5–5.1)
Sodium: 142 mmol/L (ref 135–145)

## 2021-05-24 LAB — URINALYSIS, ROUTINE W REFLEX MICROSCOPIC
Bacteria, UA: NONE SEEN
Bilirubin Urine: NEGATIVE
Glucose, UA: NEGATIVE mg/dL
Ketones, ur: NEGATIVE mg/dL
Leukocytes,Ua: NEGATIVE
Nitrite: NEGATIVE
Protein, ur: 100 mg/dL — AB
RBC / HPF: >50 RBC/hpf — ABNORMAL HIGH (ref 0–5)
Specific Gravity, Urine: 1.018 (ref 1.005–1.030)
pH: 5 (ref 5.0–8.0)

## 2021-05-24 LAB — CBC
HCT: 38.5 % (ref 36.0–46.0)
Hemoglobin: 12.1 g/dL (ref 12.0–15.0)
MCH: 27.9 pg (ref 26.0–34.0)
MCHC: 31.4 g/dL (ref 30.0–36.0)
MCV: 88.9 fL (ref 80.0–100.0)
Platelets: 339 10*3/uL (ref 150–400)
RBC: 4.33 MIL/uL (ref 3.87–5.11)
RDW: 13.3 % (ref 11.5–15.5)
WBC: 7.4 10*3/uL (ref 4.0–10.5)
nRBC: 0 % (ref 0.0–0.2)

## 2021-05-24 LAB — POC URINE PREG, ED: Preg Test, Ur: NEGATIVE

## 2021-05-24 MED ORDER — MORPHINE SULFATE (PF) 4 MG/ML IV SOLN
4.0000 mg | Freq: Once | INTRAVENOUS | Status: AC
  Administered 2021-05-24: 4 mg via INTRAVENOUS
  Filled 2021-05-24: qty 1

## 2021-05-24 MED ORDER — ONDANSETRON HCL 4 MG/2ML IJ SOLN
4.0000 mg | Freq: Once | INTRAMUSCULAR | Status: AC
  Administered 2021-05-24: 4 mg via INTRAVENOUS
  Filled 2021-05-24: qty 2

## 2021-05-24 MED ORDER — SODIUM CHLORIDE 0.9 % IV BOLUS (SEPSIS)
1000.0000 mL | Freq: Once | INTRAVENOUS | Status: AC
  Administered 2021-05-24: 1000 mL via INTRAVENOUS

## 2021-05-24 MED ORDER — TAMSULOSIN HCL 0.4 MG PO CAPS
0.4000 mg | ORAL_CAPSULE | Freq: Once | ORAL | Status: AC
  Administered 2021-05-24: 0.4 mg via ORAL
  Filled 2021-05-24: qty 1

## 2021-05-24 MED ORDER — KETOROLAC TROMETHAMINE 30 MG/ML IJ SOLN
30.0000 mg | Freq: Once | INTRAMUSCULAR | Status: AC
  Administered 2021-05-24: 30 mg via INTRAVENOUS
  Filled 2021-05-24: qty 1

## 2021-05-24 NOTE — ED Triage Notes (Signed)
Pt to ED via POV, states was seen in ED at [facility] on Tuesday, states started her menstrual cycle. Pt c/o continued L flank pain since then. Pt states continues to urinate blood at this time.  ?

## 2021-05-24 NOTE — ED Provider Notes (Signed)
? ?[facility] ?Provider Note ? ? ? Event Date/Time  ? First MD Initiated Contact with Patient 05/24/21 2319   ?  (approximate) ? ? ?History  ? ?Flank Pain ? ? ?HPI ? ?Savannah Moore is a 46 y.o. female  with h/o kidney stones who presents to the ED with L flank pain since Tuesday  the 18th, hematuria, nausea.  No V/D, fever, dysuria.  Feels like prior kidney stones.  Was seen at [facility] April 18 and was discharged with Toradol, Flomax, Zofran, Detrol but continues to be symptomatic. ? ? ?History provided by patient. ? ? ? ?Past Medical History:  ?Diagnosis Date  ? Anxiety   ? uses Lorazepam on as needed basis   ? Asthma   ? 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  ? Cancer ([place])   ? cervical dysplasia in the past- multiple procedures for this   ? Chronic kidney disease   ? kidney stones, cystoscopy & also passed on her own   ? Complication of anesthesia   ? agitated, confused, some nausea   ? Family history of adverse reaction to anesthesia   ? Father very agitated when he waits & has nausea   ? Headache   ? migraines, treated /w imitrex or aspirin  ? History of hiatal hernia   ? as a teenager , but states its resolved.   ? Liver cyst   ? Pt. told that she has dense areas in her liver & there has been discussion about biopsying   ? PONV (postoperative nausea and vomiting)   ? ? ?Past Surgical History:  ?Procedure Laterality Date  ? BREAST BIOPSY Left 04/14/2016  ? u/s core path pend  ? CHOLECYSTECTOMY N/A 04/02/2014  ? Procedure: LAPAROSCOPIC CHOLECYSTECTOMY;  Surgeon: Georganna Skeans, MD;  Location: [facility];  Service: General;  Laterality: N/A;  ? DIAGNOSTIC LAPAROSCOPY    ? ovarian cyst  ? UPPER GI ENDOSCOPY    ? WISDOM TOOTH EXTRACTION    ? ? ?MEDICATIONS:  ?Prior to Admission medications   ?Medication Sig Start Date End Date Taking? Authorizing Provider  ?albuterol (PROVENTIL HFA;VENTOLIN HFA) 108 (90 Base) MCG/ACT inhaler Inhale 1-2 puffs into the lungs every 6  (six) hours as needed for wheezing or shortness of breath.    [provider]  ?Prenatal Vit-Fe Fumarate-FA (PRENATAL VITAMIN) 27-0.8 MG TABS Take 1 tablet by mouth every morning. 07/28/17   Darlina Rumpf, CNM  ? ? ?Physical Exam  ? ?Triage Vital Signs: ?ED Triage Vitals  ?Enc Vitals Group  ?   BP 05/24/21 2107 (!) 123/92  ?   Pulse Rate 05/24/21 2107 89  ?   Resp 05/24/21 2107 17  ?   Temp 05/24/21 2107 98.4 ?F (36.9 ?C)  ?   Temp Source 05/24/21 2107 Oral  ?   SpO2 05/24/21 2107 99 %  ?   Weight 05/24/21 2107 125 lb (56.7 kg)  ?   Height 05/24/21 2107 '5\' 5"'$  (1.651 m)  ?   Head Circumference --   ?   Peak Flow --   ?   Pain Score 05/24/21 2111 5  ?   Pain Loc --   ?   Pain Edu? --   ?   Excl. in [place]? --   ? ? ?Most recent vital signs: ?Vitals:  ? 05/24/21 2107 05/25/21 0043  ?BP: (!) 123/92 114/79  ?Pulse: 89 75  ?Resp: 17 16  ?Temp: 98.4 ?F (36.9 ?  C)   ?SpO2: 99% 98%  ? ? ?CONSTITUTIONAL: Alert and oriented and responds appropriately to questions.  Appears uncomfortable ?HEAD: Normocephalic, atraumatic ?EYES: Conjunctivae clear, pupils appear equal, sclera nonicteric ?ENT: normal nose; moist mucous membranes ?NECK: Supple, normal ROM ?CARD: RRR; S1 and S2 appreciated; no murmurs, no clicks, no rubs, no gallops ?RESP: Normal chest excursion without splinting or tachypnea; breath sounds clear and equal bilaterally; no wheezes, no rhonchi, no rales, no hypoxia or respiratory distress, speaking full sentences ?ABD/GI: Normal bowel sounds; non-distended; soft, non-tender, no rebound, no guarding, no peritoneal signs ?BACK: The back appears normal ?EXT: Normal ROM in all joints; no deformity noted, no edema; no cyanosis ?SKIN: Normal color for age and race; warm; no rash on exposed skin ?NEURO: Moves all extremities equally, normal speech ?PSYCH: The patient's mood and manner are appropriate. ? ? ?ED Results / Procedures / Treatments  ? ?LABS: ?(all labs ordered are listed, but only abnormal results are  displayed) ?Labs Reviewed  ?URINALYSIS, ROUTINE W REFLEX MICROSCOPIC - Abnormal; Notable for the following components:  ?    Result Value  ? Color, Urine YELLOW (*)   ? APPearance CLOUDY (*)   ? Hgb urine dipstick LARGE (*)   ? Protein, ur 100 (*)   ? RBC / HPF >50 (*)   ? All other components within normal limits  ?BASIC METABOLIC PANEL - Abnormal; Notable for the following components:  ? Glucose, Bld 115 (*)   ? All other components within normal limits  ?CBC  ?POC URINE PREG, ED  ? ? ? ?EKG: ? ? ? ? ?RADIOLOGY: ?My personal review and interpretation of imaging: CT scan shows 7 mm mid left ureteral stone. ? ?I have personally reviewed all radiology reports.   ?CT Renal Stone Study ? ?Result Date: 05/24/2021 ?CLINICAL DATA:  Left flank pain EXAM: CT ABDOMEN AND PELVIS WITHOUT CONTRAST TECHNIQUE: Multidetector CT imaging of the abdomen and pelvis was performed following the standard protocol without IV contrast. RADIATION DOSE REDUCTION: This exam was performed according to the departmental dose-optimization program which includes automated exposure control, adjustment of the mA and/or kV according to patient size and/or use of iterative reconstruction technique. COMPARISON:  01/07/2020 FINDINGS: Lower chest: Lung bases are clear. Hepatobiliary: Scattered hepatic cysts measuring up to 15 mm in the posterior right hepatic lobe (series 2/image 18). Status post cholecystectomy. No intrahepatic or extrahepatic ductal dilatation. Pancreas: Within normal limits. Spleen: Within normal limits. Adrenals/Urinary Tract: Adrenal glands are within normal limits. Right kidney is within normal limits. Two punctate nonobstructing left lower pole renal calculi (series 2/images 36 and 37). Moderate left hydronephrosis with a 7 mm mid left ureteral calculus at the level of the sacral ureter (coronal image 60). Bladder is underdistended but unremarkable. Stomach/Bowel: Stomach is within normal limits. No evidence of bowel obstruction.  Normal appendix (series 2/image 85). Mild to moderate colonic stool burden, suggesting mild constipation. Vascular/Lymphatic: No evidence of abdominal aortic aneurysm. No suspicious abdominopelvic lymphadenopathy. Reproductive: Retroverted uterus. Bilateral ovaries are unremarkable. Other: No abdominopelvic ascites. Musculoskeletal: Visualized osseous structures are within normal limits. IMPRESSION: 7 mm mid left ureteral calculus at the level of the sacral ureter. Associated moderate left hydronephrosis. Two punctate nonobstructing left lower pole renal calculi. Electronically Signed   By: Julian Hy M.D.   On: 05/24/2021 23:42   ? ? ?PROCEDURES: ? ?Critical Care performed: No ? ? ? ?Procedures ? ? ? ?IMPRESSION / MDM / ASSESSMENT AND PLAN / ED COURSE  ?I reviewed the  triage vital signs and the nursing notes. ? ? ? ?Patient here with left-sided flank pain, hematuria. ? ?The patient is on the cardiac monitor to evaluate for evidence of arrhythmia and/or significant heart rate changes. ? ? ?DIFFERENTIAL DIAGNOSIS (includes but not limited to):   Kidney stone, UTI, kidney infection, less likely appendicitis, diverticulitis, colitis, bowel obstruction, perforation ? ? ?PLAN: We will obtain CBC, CMP, urinalysis, CT renal study.  Will give IV fluids, Toradol, morphine, Zofran.  She is also requesting a dose of Flomax. ? ? ?MEDICATIONS GIVEN IN ED: ?Medications  ?morphine (PF) 4 MG/ML injection 4 mg (4 mg Intravenous Given 05/24/21 2351)  ?ketorolac (TORADOL) 30 MG/ML injection 30 mg (30 mg Intravenous Given 05/24/21 2351)  ?ondansetron [facility]) injection 4 mg (4 mg Intravenous Given 05/24/21 2351)  ?sodium chloride 0.9 % bolus 1,000 mL (0 mLs Intravenous Stopped 05/25/21 0043)  ?tamsulosin (FLOMAX) capsule 0.4 mg (0.4 mg Oral Given 05/24/21 2351)  ? ? ? ?ED COURSE: Patient's labs show no leukocytosis.  Normal hemoglobin.  Normal renal function and electrolytes.  Urine shows large amount of blood and a small amount of  white blood cells but no bacteria.  Pregnancy test negative.  CT reviewed by myself and radiology and shows 7 mm mid left ureteral stone with moderate left hydronephrosis.  She states her pain is completely

## 2021-05-25 MED ORDER — OXYCODONE-ACETAMINOPHEN 5-325 MG PO TABS: 2.0000 | ORAL_TABLET | Freq: Four times a day (QID) | ORAL | 0 refills | Status: AC | PRN

## 2021-05-25 NOTE — Discharge Instructions (Signed)

## 2021-05-28 ENCOUNTER — Ambulatory Visit
Admission: RE | Admit: 2021-05-28 | Discharge: 2021-05-28 | Disposition: A | Discharge status: Home / Self Care | Payer: Medicaid Other | Attending: Urology | Admitting: Urology

## 2021-05-28 ENCOUNTER — Ambulatory Visit
Admission: RE | Admit: 2021-05-28 | Discharge: 2021-05-28 | Disposition: A | Discharge status: Home / Self Care | Payer: Medicaid Other | Source: Ambulatory Visit | Attending: Urology | Admitting: Urology

## 2021-05-28 ENCOUNTER — Ambulatory Visit (INDEPENDENT_AMBULATORY_CARE_PROVIDER_SITE_OTHER): Payer: Medicaid Other | Admitting: Urology

## 2021-05-28 ENCOUNTER — Other Ambulatory Visit: Payer: Self-pay | Admitting: *Deleted

## 2021-05-28 ENCOUNTER — Other Ambulatory Visit: Payer: Self-pay | Admitting: Urology

## 2021-05-28 ENCOUNTER — Encounter: Payer: Self-pay | Admitting: Urology

## 2021-05-28 VITALS — BP 123/80 | HR 66 | Ht 65.0 in | Wt 128.0 lb

## 2021-05-28 DIAGNOSIS — N23 Unspecified renal colic: Secondary | ICD-10-CM

## 2021-05-28 DIAGNOSIS — N201 Calculus of ureter: Secondary | ICD-10-CM

## 2021-05-28 DIAGNOSIS — N200 Calculus of kidney: Secondary | ICD-10-CM

## 2021-05-28 LAB — MICROSCOPIC EXAMINATION: RBC, Urine: >30 /hpf — AB (ref 0–2)

## 2021-05-28 LAB — URINALYSIS, COMPLETE
Bilirubin, UA: NEGATIVE
Glucose, UA: NEGATIVE
Ketones, UA: NEGATIVE
Leukocytes,UA: NEGATIVE
Nitrite, UA: NEGATIVE
Specific Gravity, UA: >1.03 — ABNORMAL HIGH (ref 1.005–1.030)
Urobilinogen, Ur: 0.2 mg/dL (ref 0.2–1.0)
pH, UA: 6 (ref 5.0–7.5)

## 2021-05-28 MED ORDER — CEPHALEXIN 500 MG PO CAPS: 500.0000 mg | ORAL_CAPSULE | Freq: Once | ORAL | Status: AC

## 2021-05-28 MED ORDER — KETOROLAC TROMETHAMINE 60 MG/2ML IM SOLN
15.0000 mg | Freq: Once | INTRAMUSCULAR | Status: AC
  Administered 2021-05-28: 15 mg via INTRAMUSCULAR

## 2021-05-28 MED ORDER — DIAZEPAM 5 MG PO TABS: 10.0000 mg | ORAL_TABLET | ORAL | Status: AC

## 2021-05-28 MED ORDER — KETOROLAC TROMETHAMINE 10 MG PO TABS: 10.0000 mg | ORAL_TABLET | Freq: Four times a day (QID) | ORAL | 0 refills | Status: AC | PRN

## 2021-05-28 MED ORDER — DIPHENHYDRAMINE HCL 25 MG PO CAPS: 25.0000 mg | ORAL_CAPSULE | ORAL | Status: AC

## 2021-05-28 MED ORDER — SODIUM CHLORIDE 0.9 % IV SOLN: INTRAVENOUS | Status: DC

## 2021-05-28 MED ORDER — ONDANSETRON HCL 4 MG/2ML IJ SOLN: 4.0000 mg | Freq: Once | INTRAMUSCULAR | Status: AC

## 2021-05-28 NOTE — Progress Notes (Signed)
[facility] ORDER FORM ? ?Expected date of procedure: 05/29/2021 ? ?Surgeon: Hollice Espy, MD ? ?Post op standing: 2-4wk follow up w/KUB prior with PA ? ?Anticoagulation/Aspirin/NSAID standing order: Okay to continue Toradol, distal stone ? ?Anesthesia standing order: MAC ? ?VTE standing: SCD's ? ?Dx: Left Ureteral Stone ? ?Procedure: left Extracorporeal shock wave lithotripsy ? ?CPT : [phone] ? ?Standing Order Set:  ? ?*NPO after mn, KUB ? ?*NS 167m/hr, Keflex 5041mPO, Benadryl 2514mO, Valium 25m42m, Zofran 4mg 40m? ? ? ?Medications if other than standing orders:   NONE ? ? ?

## 2021-05-28 NOTE — Progress Notes (Signed)
? ?05/28/21 ?11:24 AM  ? ?Savannah Moore ?[birth year] ?[id number] ? ?CC: Left ureteral stone ? ?HPI: ?46 year old female who has had 2 ER visits over the last few weeks for left-sided flank pain, with CT showing a 7 mm left mid ureteral stone.  Urinalysis was noninfected and she was discharged with medical expulsive therapy.  She continues to have intermittent left-sided flank pain, but Toradol has worked well for pain control.  She denies any fevers, chills, or urinary symptoms.  She has a distant history of ureteroscopy over 15 years ago that she tolerated well. ? ?Urinalysis today with 0-5 WBCs, greater than 30 RBCs, few bacteria, no yeast, nitrite negative, no leukocytes. ? ? ?PMH: ?Past Medical History:  ?Diagnosis Date  ? Anxiety   ? uses Lorazepam on as needed basis   ? Asthma   ? 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  ? Cancer ([place])   ? cervical dysplasia in the past- multiple procedures for this   ? Chronic kidney disease   ? kidney stones, cystoscopy & also passed on her own   ? Complication of anesthesia   ? agitated, confused, some nausea   ? Family history of adverse reaction to anesthesia   ? Father very agitated when he waits & has nausea   ? Headache   ? migraines, treated /w imitrex or aspirin  ? History of hiatal hernia   ? as a teenager , but states its resolved.   ? Kidney stone   ? Liver cyst   ? Pt. told that she has dense areas in her liver & there has been discussion about biopsying   ? PONV (postoperative nausea and vomiting)   ? ? ?Surgical History: ?Past Surgical History:  ?Procedure Laterality Date  ? BREAST BIOPSY Left 04/14/2016  ? u/s core path pend  ? CHOLECYSTECTOMY N/A 04/02/2014  ? Procedure: LAPAROSCOPIC CHOLECYSTECTOMY;  Surgeon: Georganna Skeans, MD;  Location: [facility];  Service: General;  Laterality: N/A;  ? DIAGNOSTIC LAPAROSCOPY    ? ovarian cyst  ? UPPER GI ENDOSCOPY    ? WISDOM TOOTH EXTRACTION    ? ? ? ?Family History: ?Family History  ?Problem Relation  Age of Onset  ? Breast cancer Neg Hx   ? ? ?Social History:  reports that she has been smoking cigarettes. She has been smoking an average of .5 packs per day. She has never used smokeless tobacco. She reports that she does not currently use alcohol. She reports current drug use. Frequency: 3.00 times per week. Drug: Marijuana. ? ?Physical Exam: ?BP 123/80 (BP Location: Left Arm, Patient Position: Sitting, Cuff Size: Normal)   Pulse 66   Ht '5\' 5"'$  (1.651 m)   Wt 128 lb (58.1 kg)   LMP 05/24/2021 (Exact Date)   BMI 21.30 kg/m?   ? ?Constitutional:  Alert and oriented, No acute distress. ?Cardiovascular: No clubbing, cyanosis, or edema. ?Respiratory: Normal respiratory effort, no increased work of breathing. ?GI: Abdomen is soft, nontender, nondistended, no abdominal masses ? ? ?Laboratory Data: ?Reviewed, see HPI ? ?Pertinent Imaging: ?I have personally viewed and interpreted the CT showing a 7 mm left mid ureteral stone with hydronephrosis, 600HU, <10cm SSD.  On KUB today stone has migrated slightly distally, but clearly seen on KUB. ? ?Assessment & Plan:   ?46 year old female with 7 mm left distal to mid ureteral stone and multiple ER visits with ongoing renal colic, no clinical or laboratory evidence of infection. ? ?We discussed various treatment options  for urolithiasis including observation with or without medical expulsive therapy, shockwave lithotripsy (SWL), ureteroscopy and laser lithotripsy with stent placement, and percutaneous nephrolithotomy. ? ?We discussed that management is based on stone size, location, density, patient co-morbidities, and patient preference.  ? ?Stones <18m in size have a >80% spontaneous passage rate. Data surrounding the use of tamsulosin for medical expulsive therapy is controversial, but meta analyses suggests it is most efficacious for distal stones between 5-165min size. Possible side effects include dizziness/lightheadedness, and retrograde ejaculation. ? ?SWL has a  lower stone free rate in a single procedure, but also a lower complication rate compared to ureteroscopy and avoids a stent and associated stent related symptoms. Possible complications include renal hematoma, steinstrasse, and need for additional treatment. ? ?Ureteroscopy with laser lithotripsy and stent placement has a higher stone free rate than SWL in a single procedure, however increased complication rate including possible infection, ureteral injury, bleeding, and stent related morbidity. Common stent related symptoms include dysuria, urgency/frequency, and flank pain. ? ?-After an extensive discussion of the risks and benefits of the above treatment options, the patient would like to proceed with left shockwave lithotripsy tomorrow. ?-Toradol injection given in clinic today for ongoing renal colic ? ? ?BrNickolas MadridMD ?05/28/2021 ? ?[id number] ?[place] [phone] ? ?

## 2021-05-28 NOTE — Patient Instructions (Signed)
Lithotripsy  Lithotripsy is a treatment that can help break up kidney stones that are too large to pass on their own. This is a nonsurgical procedure that crushes a kidney stone with shock waves. These shock waves pass through your body and focus on the kidney stone. They cause the kidney stone to break up into smaller pieces while it is still in the urinary tract. The smaller pieces of stone can pass more easily out of your body in the urine. Tell a health care provider about: Any allergies you have. All medicines you are taking, including vitamins, herbs, eye drops, creams, and over-the-counter medicines. Any problems you or family members have had with anesthetic medicines. Any blood disorders you have. Any surgeries you have had. Any medical conditions you have. Whether you are pregnant or may be pregnant. What are the risks? Generally, this is a safe procedure. However, problems may occur, including: Infection. Bleeding from the kidney. Bruising of the kidney or skin. Scarring of the kidney, which can lead to: Increased blood pressure. Poor kidney function. Return (recurrence) of kidney stones. Damage to other structures or organs, such as the liver, colon, spleen, or pancreas. Blockage (obstruction) of the tube that carries urine from the kidney to the bladder (ureter). Failure of the kidney stone to break into pieces (fragments). What happens before the procedure? Staying hydrated Follow instructions from your health care provider about hydration, which may include: Up to 2 hours before the procedure - you may continue to drink clear liquids, such as water, clear fruit juice, black coffee, and plain tea. Eating and drinking restrictions Follow instructions from your health care provider about eating and drinking, which may include: 8 hours before the procedure - stop eating heavy meals or foods, such as meat, fried foods, or fatty foods. 6 hours before the procedure - stop eating  light meals or foods, such as toast or cereal. 6 hours before the procedure - stop drinking milk or drinks that contain milk. 2 hours before the procedure - stop drinking clear liquids. Medicines Ask your health care provider about: Changing or stopping your regular medicines. This is especially important if you are taking diabetes medicines or blood thinners. Taking medicines such as aspirin and ibuprofen. These medicines can thin your blood. Do not take these medicines unless your health care provider tells you to take them. Taking over-the-counter medicines, vitamins, herbs, and supplements. Tests You may have tests, such as: Blood tests. Urine tests. Imaging tests, such as a CT scan. General instructions Plan to have someone take you home from the hospital or clinic. If you will be going home right after the procedure, plan to have someone with you for 24 hours. Ask your health care provider what steps will be taken to help prevent infection. These may include washing skin with a germ-killing soap. What happens during the procedure?  An IV will be inserted into one of your veins. You will be given one or more of the following: A medicine to help you relax (sedative). A medicine to make you fall asleep (general anesthetic). A water-filled cushion may be placed behind your kidney or on your abdomen. In some cases, you may be placed in a tub of lukewarm water. Your body will be positioned in a way that makes it easy to target the kidney stone. An X-ray or ultrasound exam will be done to locate your stone. Shock waves will be aimed at the stone. If you are awake, you may feel a tapping sensation   as the shock waves pass through your body. A flexible tube with holes in it (stent) may be placed in the ureter. This will help keep urine flowing from the kidney if the fragments of the stone have been blocking the ureter. The procedure may vary among health care providers and hospitals. What  happens after the procedure? You may have an X-ray to see whether the procedure was able to break up the kidney stone and how much of the stone has passed. If large stone fragments remain after treatment, you may need to have a second procedure at a later time. Your blood pressure, heart rate, breathing rate, and blood oxygen level will be monitored until you leave the hospital or clinic. You may be given antibiotics or pain medicine as needed. If a stent was placed in your ureter during surgery, it may stay in place for a few weeks. You may need to strain your urine to collect pieces of the kidney stone for testing. You will need to drink plenty of water. If you were given a sedative during the procedure, it can affect you for several hours. Do not drive or operate machinery until your health care provider says that it is safe. Summary Lithotripsy is a treatment that can help break up kidney stones that are too large to pass on their own. Lithotripsy is a nonsurgical procedure that crushes a kidney stone with shock waves. Generally, this is a safe procedure. However, problems may occur, including damage to the kidney or other organs, infection, or obstruction of the tube that carries urine from the kidney to the bladder (ureter). You may have a stent placed in your ureter to help drain your urine. This stent may stay in place for a few weeks. After the procedure, you will need to drink plenty of water. You may be asked to strain your urine to collect pieces of the kidney stone for testing. This information is not intended to replace advice given to you by your health care provider. Make sure you discuss any questions you have with your health care provider. Document Revised: 12/16/2020 Document Reviewed: 09/23/2020 Elsevier Patient Education  2023 [employer] Inc.  

## 2021-05-28 NOTE — H&P (View-Only) (Signed)
? ?05/28/21 ?11:24 AM  ? ?Savannah Moore ?1975-04-08 ?[id number] ? ?CC: Left ureteral stone ? ?HPI: ?46 year old female who has had 2 ER visits over the last few weeks for left-sided flank pain, with CT showing a 7 mm left mid ureteral stone.  Urinalysis was noninfected and she was discharged with medical expulsive therapy.  She continues to have intermittent left-sided flank pain, but Toradol has worked well for pain control.  She denies any fevers, chills, or urinary symptoms.  She has a distant history of ureteroscopy over 15 years ago that she tolerated well. ? ?Urinalysis today with 0-5 WBCs, greater than 30 RBCs, few bacteria, no yeast, nitrite negative, no leukocytes. ? ? ?PMH: ?Past Medical History:  ?Diagnosis Date  ? Anxiety   ? uses Lorazepam on as needed basis   ? Asthma   ? 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  ? Cancer ([place])   ? cervical dysplasia in the past- multiple procedures for this   ? Chronic kidney disease   ? kidney stones, cystoscopy & also passed on her own   ? Complication of anesthesia   ? agitated, confused, some nausea   ? Family history of adverse reaction to anesthesia   ? Father very agitated when he waits & has nausea   ? Headache   ? migraines, treated /w imitrex or aspirin  ? History of hiatal hernia   ? as a teenager , but states its resolved.   ? Kidney stone   ? Liver cyst   ? Pt. told that she has dense areas in her liver & there has been discussion about biopsying   ? PONV (postoperative nausea and vomiting)   ? ? ?Surgical History: ?Past Surgical History:  ?Procedure Laterality Date  ? BREAST BIOPSY Left 04/14/2016  ? u/s core path pend  ? CHOLECYSTECTOMY N/A 04/02/2014  ? Procedure: LAPAROSCOPIC CHOLECYSTECTOMY;  Surgeon: Georganna Skeans, MD;  Location: [facility];  Service: General;  Laterality: N/A;  ? DIAGNOSTIC LAPAROSCOPY    ? ovarian cyst  ? UPPER GI ENDOSCOPY    ? WISDOM TOOTH EXTRACTION    ? ? ? ?Family History: ?Family History  ?Problem Relation  Age of Onset  ? Breast cancer Neg Hx   ? ? ?Social History:  reports that she has been smoking cigarettes. She has been smoking an average of .5 packs per day. She has never used smokeless tobacco. She reports that she does not currently use alcohol. She reports current drug use. Frequency: 3.00 times per week. Drug: Marijuana. ? ?Physical Exam: ?BP 123/80 (BP Location: Left Arm, Patient Position: Sitting, Cuff Size: Normal)   Pulse 66   Ht '5\' 5"'$  (1.651 m)   Wt 128 lb (58.1 kg)   LMP 05/24/2021 (Exact Date)   BMI 21.30 kg/m?   ? ?Constitutional:  Alert and oriented, No acute distress. ?Cardiovascular: No clubbing, cyanosis, or edema. ?Respiratory: Normal respiratory effort, no increased work of breathing. ?GI: Abdomen is soft, nontender, nondistended, no abdominal masses ? ? ?Laboratory Data: ?Reviewed, see HPI ? ?Pertinent Imaging: ?I have personally viewed and interpreted the CT showing a 7 mm left mid ureteral stone with hydronephrosis, 600HU, <10cm SSD.  On KUB today stone has migrated slightly distally, but clearly seen on KUB. ? ?Assessment & Plan:   ?46 year old female with 7 mm left distal to mid ureteral stone and multiple ER visits with ongoing renal colic, no clinical or laboratory evidence of infection. ? ?We discussed various treatment options  for urolithiasis including observation with or without medical expulsive therapy, shockwave lithotripsy (SWL), ureteroscopy and laser lithotripsy with stent placement, and percutaneous nephrolithotomy. ? ?We discussed that management is based on stone size, location, density, patient co-morbidities, and patient preference.  ? ?Stones <74m in size have a >80% spontaneous passage rate. Data surrounding the use of tamsulosin for medical expulsive therapy is controversial, but meta analyses suggests it is most efficacious for distal stones between 5-127min size. Possible side effects include dizziness/lightheadedness, and retrograde ejaculation. ? ?SWL has a  lower stone free rate in a single procedure, but also a lower complication rate compared to ureteroscopy and avoids a stent and associated stent related symptoms. Possible complications include renal hematoma, steinstrasse, and need for additional treatment. ? ?Ureteroscopy with laser lithotripsy and stent placement has a higher stone free rate than SWL in a single procedure, however increased complication rate including possible infection, ureteral injury, bleeding, and stent related morbidity. Common stent related symptoms include dysuria, urgency/frequency, and flank pain. ? ?-After an extensive discussion of the risks and benefits of the above treatment options, the patient would like to proceed with left shockwave lithotripsy tomorrow. ?-Toradol injection given in clinic today for ongoing renal colic ? ? ?BrNickolas MadridMD ?05/28/2021 ? ?[id number] ?[place] [phone] ?

## 2021-05-29 ENCOUNTER — Other Ambulatory Visit: Payer: Self-pay

## 2021-05-29 ENCOUNTER — Encounter
Admission: RE | Disposition: A | Discharge status: Home / Self Care | Payer: Self-pay | Source: Home / Self Care | Attending: Urology

## 2021-05-29 ENCOUNTER — Ambulatory Visit
Admission: RE | Admit: 2021-05-29 | Discharge: 2021-05-29 | Disposition: A | Discharge status: Home / Self Care | Payer: Medicaid Other | Attending: Urology | Admitting: Urology

## 2021-05-29 ENCOUNTER — Encounter: Payer: Self-pay | Admitting: Urology

## 2021-05-29 ENCOUNTER — Ambulatory Visit: Payer: Medicaid Other

## 2021-05-29 DIAGNOSIS — Z87442 Personal history of urinary calculi: Secondary | ICD-10-CM | POA: Insufficient documentation

## 2021-05-29 DIAGNOSIS — N201 Calculus of ureter: Secondary | ICD-10-CM | POA: Diagnosis present

## 2021-05-29 HISTORY — PX: EXTRACORPOREAL SHOCK WAVE LITHOTRIPSY: SHX1557

## 2021-05-29 LAB — POCT PREGNANCY, URINE: Preg Test, Ur: NEGATIVE

## 2021-05-29 SURGERY — LITHOTRIPSY, ESWL
Anesthesia: Moderate Sedation | Laterality: Left

## 2021-05-29 MED ORDER — CEPHALEXIN 500 MG PO CAPS
ORAL_CAPSULE | ORAL | Status: AC
  Administered 2021-05-29: 500 mg via ORAL
  Filled 2021-05-29: qty 1

## 2021-05-29 MED ORDER — CHLORHEXIDINE GLUCONATE 0.12 % MT SOLN
OROMUCOSAL | Status: DC
Start: 2021-05-29 — End: 2021-05-29
  Filled 2021-05-29: qty 15

## 2021-05-29 MED ORDER — DIPHENHYDRAMINE HCL 25 MG PO CAPS
ORAL_CAPSULE | ORAL | Status: AC
  Administered 2021-05-29: 25 mg via ORAL
  Filled 2021-05-29: qty 1

## 2021-05-29 MED ORDER — ONDANSETRON HCL 4 MG/2ML IJ SOLN
INTRAMUSCULAR | Status: AC
  Administered 2021-05-29: 4 mg via INTRAVENOUS
  Filled 2021-05-29: qty 2

## 2021-05-29 MED ORDER — DIAZEPAM 5 MG PO TABS
ORAL_TABLET | ORAL | Status: AC
  Administered 2021-05-29: 10 mg via ORAL
  Filled 2021-05-29: qty 2

## 2021-05-29 NOTE — Interval H&P Note (Signed)
History and Physical Interval Note: ? ?05/29/2021 ?9:40 AM ? ?Savannah Moore  has presented today for surgery, with the diagnosis of Left Ureteral Stone.  The various methods of treatment have been discussed with the patient and family. After consideration of risks, benefits and other options for treatment, the patient has consented to  Procedure(s): ?EXTRACORPOREAL SHOCK WAVE LITHOTRIPSY (ESWL) (Left) as a surgical intervention.  The patient's history has been reviewed, patient examined, no change in status, stable for surgery.  I have reviewed the patient's chart and labs.  Questions were answered to the patient's satisfaction.   ? ? ?Hollice Espy ? ? ?

## 2021-05-29 NOTE — Discharge Instructions (Addendum)
See [facility] discharge instructions in chart. AMBULATORY SURGERY  DISCHARGE INSTRUCTIONS   The drugs that you were given will stay in your system until tomorrow so for the next 24 hours you should not:  Drive an automobile Make any legal decisions Drink any alcoholic beverage   You may resume regular meals tomorrow.  Today it is better to start with liquids and gradually work up to solid foods.  You may eat anything you prefer, but it is better to start with liquids, then soup and crackers, and gradually work up to solid foods.   Please notify your doctor immediately if you have any unusual bleeding, trouble breathing, redness and pain at the surgery site, drainage, fever, or pain not relieved by medication.    Additional Instructions:        Please contact your physician with any problems or Same Day Surgery at [phone], Monday through Friday 6 am to 4 pm, or [place] [facility] [place] [facility] number at [phone].  

## 2021-05-30 ENCOUNTER — Other Ambulatory Visit: Payer: Self-pay

## 2021-05-30 DIAGNOSIS — N201 Calculus of ureter: Secondary | ICD-10-CM

## 2021-06-01 ENCOUNTER — Encounter: Payer: Self-pay | Admitting: Urology

## 2021-06-24 ENCOUNTER — Ambulatory Visit: Payer: Medicaid Other | Admitting: Physician Assistant

## 2021-07-01 ENCOUNTER — Telehealth: Payer: Self-pay | Admitting: Physician Assistant

## 2021-07-01 NOTE — Telephone Encounter (Signed)
left messgae to r/s appt  2-4 week follow up with KUB prior

## 2021-09-05 ENCOUNTER — Other Ambulatory Visit: Payer: Self-pay | Admitting: Physician Assistant

## 2021-09-05 DIAGNOSIS — Z1231 Encounter for screening mammogram for malignant neoplasm of breast: Secondary | ICD-10-CM

## 2021-09-11 ENCOUNTER — Other Ambulatory Visit: Payer: Self-pay | Admitting: Family Medicine

## 2022-03-27 ENCOUNTER — Encounter: Payer: Self-pay | Admitting: Internal Medicine

## 2022-03-27 ENCOUNTER — Other Ambulatory Visit: Payer: Self-pay | Admitting: Internal Medicine

## 2022-03-27 DIAGNOSIS — N63 Unspecified lump in unspecified breast: Secondary | ICD-10-CM

## 2022-04-07 ENCOUNTER — Ambulatory Visit
Admission: RE | Admit: 2022-04-07 | Discharge: 2022-04-07 | Disposition: A | Discharge status: Home / Self Care | Payer: Managed Care, Other (non HMO) | Source: Ambulatory Visit | Attending: Internal Medicine | Admitting: Internal Medicine

## 2022-04-07 DIAGNOSIS — N63 Unspecified lump in unspecified breast: Secondary | ICD-10-CM | POA: Diagnosis present

## 2022-04-08 ENCOUNTER — Encounter: Payer: Self-pay | Admitting: Internal Medicine

## 2022-04-14 ENCOUNTER — Other Ambulatory Visit: Payer: Self-pay | Admitting: Family Medicine

## 2022-04-14 DIAGNOSIS — R928 Other abnormal and inconclusive findings on diagnostic imaging of breast: Secondary | ICD-10-CM

## 2022-04-14 DIAGNOSIS — R921 Mammographic calcification found on diagnostic imaging of breast: Secondary | ICD-10-CM

## 2022-04-17 ENCOUNTER — Ambulatory Visit
Admission: RE | Admit: 2022-04-17 | Discharge: 2022-04-17 | Disposition: A | Discharge status: Home / Self Care | Payer: Managed Care, Other (non HMO) | Source: Ambulatory Visit | Attending: Family Medicine | Admitting: Family Medicine

## 2022-04-17 DIAGNOSIS — R921 Mammographic calcification found on diagnostic imaging of breast: Secondary | ICD-10-CM | POA: Insufficient documentation

## 2022-04-17 DIAGNOSIS — R928 Other abnormal and inconclusive findings on diagnostic imaging of breast: Secondary | ICD-10-CM | POA: Insufficient documentation

## 2022-04-17 HISTORY — PX: BREAST BIOPSY: SHX20

## 2022-04-17 MED ORDER — LIDOCAINE HCL (PF) 1 % IJ SOLN
5.0000 mL | Freq: Once | INTRAMUSCULAR | Status: AC
  Administered 2022-04-17: 5 mL
  Filled 2022-04-17: qty 5

## 2022-04-17 MED ORDER — LIDOCAINE-EPINEPHRINE 1 %-1:100000 IJ SOLN
10.0000 mL | Freq: Once | INTRAMUSCULAR | Status: AC
  Administered 2022-04-17: 10 mL

## 2022-04-17 MED ORDER — LIDOCAINE-EPINEPHRINE 1 %-1:100000 IJ SOLN
10.0000 mL | Freq: Once | INTRAMUSCULAR | Status: AC
Start: 2022-04-17 — End: 2022-04-17
  Administered 2022-04-17: 10 mL
  Filled 2022-04-17: qty 10

## 2022-04-17 MED ORDER — LIDOCAINE HCL (PF) 1 % IJ SOLN
5.0000 mL | Freq: Once | INTRAMUSCULAR | Status: AC
Start: 2022-04-17 — End: 2022-04-17
  Administered 2022-04-17: 5 mL

## 2022-04-20 LAB — SURGICAL PATHOLOGY

## 2022-12-24 ENCOUNTER — Emergency Department
Admission: EM | Admit: 2022-12-24 | Discharge: 2022-12-25 | Disposition: A | Discharge status: Home / Self Care | Payer: Managed Care, Other (non HMO) | Attending: Emergency Medicine | Admitting: Emergency Medicine

## 2022-12-24 ENCOUNTER — Other Ambulatory Visit: Payer: Self-pay

## 2022-12-24 DIAGNOSIS — J45909 Unspecified asthma, uncomplicated: Secondary | ICD-10-CM | POA: Insufficient documentation

## 2022-12-24 DIAGNOSIS — R079 Chest pain, unspecified: Secondary | ICD-10-CM | POA: Diagnosis present

## 2022-12-24 DIAGNOSIS — N189 Chronic kidney disease, unspecified: Secondary | ICD-10-CM | POA: Diagnosis not present

## 2022-12-24 DIAGNOSIS — Z8541 Personal history of malignant neoplasm of cervix uteri: Secondary | ICD-10-CM | POA: Insufficient documentation

## 2022-12-24 DIAGNOSIS — R0781 Pleurodynia: Secondary | ICD-10-CM | POA: Diagnosis not present

## 2022-12-24 NOTE — ED Triage Notes (Signed)
Pt reports discomfort under left shoulder blade that radiates into chest. Pt denies any known injury or lifting heavy objects. Pt denies shortness of breath, states she does intermittently feel dizzy.

## 2022-12-25 ENCOUNTER — Emergency Department: Payer: Managed Care, Other (non HMO)

## 2022-12-25 LAB — BASIC METABOLIC PANEL
Anion gap: 8 (ref 5–15)
BUN: 14 mg/dL (ref 6–20)
CO2: 27 mmol/L (ref 22–32)
Calcium: 9 mg/dL (ref 8.9–10.3)
Chloride: 104 mmol/L (ref 98–111)
Creatinine, Ser: 1.01 mg/dL — ABNORMAL HIGH (ref 0.44–1.00)
GFR, Estimated: >60 mL/min (ref >60)
Glucose, Bld: 97 mg/dL (ref 70–99)
Potassium: 3.5 mmol/L (ref 3.5–5.1)
Sodium: 139 mmol/L (ref 135–145)

## 2022-12-25 LAB — CBC WITH DIFFERENTIAL/PLATELET
Abs Immature Granulocytes: 0.03 10*3/uL (ref 0.00–0.07)
Basophils Absolute: 0.1 10*3/uL (ref 0.0–0.1)
Basophils Relative: 1 %
Eosinophils Absolute: 0.1 10*3/uL (ref 0.0–0.5)
Eosinophils Relative: 2 %
HCT: 37.7 % (ref 36.0–46.0)
Hemoglobin: 12.3 g/dL (ref 12.0–15.0)
Immature Granulocytes: 0 %
Lymphocytes Relative: 22 %
Lymphs Abs: 1.8 10*3/uL (ref 0.7–4.0)
MCH: 28.9 pg (ref 26.0–34.0)
MCHC: 32.6 g/dL (ref 30.0–36.0)
MCV: 88.5 fL (ref 80.0–100.0)
Monocytes Absolute: 0.6 10*3/uL (ref 0.1–1.0)
Monocytes Relative: 8 %
Neutro Abs: 5.3 10*3/uL (ref 1.7–7.7)
Neutrophils Relative %: 67 %
Platelets: 325 10*3/uL (ref 150–400)
RBC: 4.26 MIL/uL (ref 3.87–5.11)
RDW: 13.2 % (ref 11.5–15.5)
WBC: 8 10*3/uL (ref 4.0–10.5)
nRBC: 0 % (ref 0.0–0.2)

## 2022-12-25 LAB — TROPONIN I (HIGH SENSITIVITY): Troponin I (High Sensitivity): <2 ng/L (ref <18)

## 2022-12-25 LAB — HCG, QUANTITATIVE, PREGNANCY: hCG, Beta Chain, Quant, S: <1 m[IU]/mL (ref <5)

## 2022-12-25 LAB — D-DIMER, QUANTITATIVE: D-Dimer, Quant: <0.27 ug{FEU}/mL (ref 0.00–0.50)

## 2022-12-25 MED ORDER — KETOROLAC TROMETHAMINE 30 MG/ML IJ SOLN
30.0000 mg | Freq: Once | INTRAMUSCULAR | Status: AC
Start: 2022-12-25 — End: 2022-12-25
  Administered 2022-12-25: 30 mg via INTRAVENOUS
  Filled 2022-12-25: qty 1

## 2022-12-25 NOTE — Discharge Instructions (Signed)
You may alternate Tylenol 1000 mg every 6 hours as needed for pain, fever and Ibuprofen 800 mg every 6-8 hours as needed for pain, fever.  Please take Ibuprofen with food.  Do not take more than 4000 mg of Tylenol (acetaminophen) in a 24 hour period. ° °

## 2022-12-25 NOTE — ED Provider Notes (Signed)
[facility] Provider Note    Event Date/Time   First MD Initiated Contact with Patient 12/24/22 2351     (approximate)   History   Chest Pain   HPI  Savannah Moore is a 47 y.o. female with history of asthma, anxiety, hiatal hernia who presents to the emergency department with pain underneath the left scapula that radiates into the chest worse with deep inspiration for the past day.  States that makes her feel like her breath catches.  She denies any fevers, cough, nausea, vomiting, diaphoresis, dizziness.  No history of PE, DVT, exogenous estrogen use, recent fractures, surgery, trauma, hospitalization, prolonged travel or other immobilization. No lower extremity swelling or pain. No calf tenderness.  She is a smoker.  No injury to her back or heavy lifting.   She states pain started this morning when she woke up.  History provided by patient.    Past Medical History:  Diagnosis Date   Anxiety    uses Lorazepam on as needed basis    Asthma    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   Cancer ([facility])    cervical dysplasia in the past- multiple procedures for this    Chronic kidney disease    kidney stones, cystoscopy & also passed on her own    Complication of anesthesia    agitated, confused, some nausea    Family history of adverse reaction to anesthesia    Father very agitated when he waits & has nausea    Headache    migraines, treated /w imitrex or aspirin   History of hiatal hernia    as a teenager , but states its resolved.    Kidney stone    Liver cyst    Pt. told that she has dense areas in her liver & there has been discussion about biopsying    PONV (postoperative nausea and vomiting)     Past Surgical History:  Procedure Laterality Date   BREAST BIOPSY Left 04/14/2016   u/s core benign   BREAST BIOPSY Right 04/17/2022   stereo bx, calcs, "X" clip-path pending   BREAST BIOPSY Right 04/17/2022   syereo bx,  calcs, COIL clip-path pending   BREAST BIOPSY Right 04/17/2022   MM RT BREAST BX W LOC DEV 1ST LESION IMAGE BX SPEC STEREO GUIDE 04/17/2022 [facility]   BREAST BIOPSY Right 04/17/2022   MM RT BREAST BX W LOC DEV EA AD LESION IMG BX SPEC STEREO GUIDE 04/17/2022 [facility]   CHOLECYSTECTOMY N/A 04/02/2014   Procedure: LAPAROSCOPIC CHOLECYSTECTOMY;  Surgeon: Violeta Gelinas, MD;  Location: [facility];  Service: General;  Laterality: N/A;   DIAGNOSTIC LAPAROSCOPY     ovarian cyst   EXTRACORPOREAL SHOCK WAVE LITHOTRIPSY Left 05/29/2021   Procedure: EXTRACORPOREAL SHOCK WAVE LITHOTRIPSY (ESWL);  Surgeon: Vanna Scotland, MD;  Location: [facility];  Service: Urology;  Laterality: Left;   UPPER GI ENDOSCOPY     WISDOM TOOTH EXTRACTION      MEDICATIONS:  Prior to Admission medications   Medication Sig Start Date End Date Taking? Authorizing Provider  ketorolac (TORADOL) 10 MG tablet Take 1 tablet (10 mg total) by mouth every 6 (six) hours as needed. 05/28/21   Sondra Come, MD  lisinopril (ZESTRIL) 10 MG tablet TAKE (1) TABLET DAILY FOR HIGH BLOOD PRESSURE. 05/02/21   [provider]  ondansetron (ZOFRAN-ODT) 4 MG disintegrating tablet Take 4 mg by mouth every 8 (eight) hours as needed. 05/21/21   [provider]  SUMAtriptan (IMITREX) 100 MG tablet ONE BY MOUTH AS NEEDED MIGRAINE, MAY REPEAT X 1 AFTER 2 HOURS. MAX 2 IN 24 HOURS 05/02/21   [provider]  tamsulosin (FLOMAX) 0.4 MG CAPS capsule Take 0.4 mg by mouth daily. 05/21/21   [provider]  tolterodine (DETROL) 1 MG tablet Take by mouth. 05/20/21   [provider]    Physical Exam   Triage Vital Signs: ED Triage Vitals  Encounter Vitals Group     BP 12/24/22 2353 (!) 134/91     Systolic BP Percentile --      Diastolic BP Percentile --      Pulse Rate 12/24/22 2353 91     Resp 12/24/22 2353 18     Temp 12/24/22 2353 97.8 F (36.6 C)     Temp Source 12/24/22 2353 Oral     SpO2 12/24/22  2353 100 %     Weight 12/24/22 2351 130 lb (59 kg)     Height 12/24/22 2351 5\' 5"  (1.651 m)     Head Circumference --      Peak Flow --      Pain Score 12/24/22 2351 5     Pain Loc --      Pain Education --      Exclude from Growth Chart --     Most recent vital signs: Vitals:   12/24/22 2353  BP: (!) 134/91  Pulse: 91  Resp: 18  Temp: 97.8 F (36.6 C)  SpO2: 100%    CONSTITUTIONAL: Alert, responds appropriately to questions. Well-appearing; well-nourished HEAD: Normocephalic, atraumatic EYES: Conjunctivae clear, pupils appear equal, sclera nonicteric ENT: normal nose; moist mucous membranes NECK: Supple, normal ROM CARD: RRR; S1 and S2 appreciated RESP: Normal chest excursion without splinting or tachypnea; breath sounds clear and equal bilaterally; no wheezes, no rhonchi, no rales, no hypoxia or respiratory distress, speaking full sentences ABD/GI: Non-distended; soft, non-tender, no rebound, no guarding, no peritoneal signs BACK: The back appears normal, no midline spinal tenderness or step-off or deformity, no rash or other lesions noted, pain is not reproducible with palpation of the back EXT: Normal ROM in all joints; no deformity noted, no edema, no calf tenderness or calf swelling SKIN: Normal color for age and race; warm; no rash on exposed skin NEURO: Moves all extremities equally, normal speech PSYCH: The patient's mood and manner are appropriate.   ED Results / Procedures / Treatments   LABS: (all labs ordered are listed, but only abnormal results are displayed) Labs Reviewed  BASIC METABOLIC PANEL - Abnormal; Notable for the following components:      Result Value   Creatinine, Ser 1.01 (*)    All other components within normal limits  CBC WITH DIFFERENTIAL/PLATELET  D-DIMER, QUANTITATIVE  HCG, QUANTITATIVE, PREGNANCY  TROPONIN I (HIGH SENSITIVITY)     EKG:  EKG Interpretation Date/Time:  Thursday December 24 2022 23:57:25 EST Ventricular Rate:   80 PR Interval:  159 QRS Duration:  99 QT Interval:  390 QTC Calculation: 450 R Axis:   70  Text Interpretation: Sinus rhythm Probable inferior infarct, old Baseline wander in lead(s) V3 Confirmed by Rochele Raring [phone]) on 12/25/2022 12:01:45 AM         RADIOLOGY: My personal review and interpretation of imaging: Chest x-ray clear.  I have personally reviewed all radiology reports.   DG Chest 2 View  Result Date: 12/25/2022 CLINICAL DATA:  Chest pain EXAM: CHEST - 2 VIEW COMPARISON:  None Available. FINDINGS:  The heart size and mediastinal contours are within normal limits. Both lungs are clear. The visualized skeletal structures are unremarkable. IMPRESSION: No active cardiopulmonary disease. Electronically Signed   By: Alcide Clever M.D.   On: 12/25/2022 00:24     PROCEDURES:  Critical Care performed: No     .1-3 Lead EKG Interpretation  Performed by: Wisdom Seybold, Layla Maw, DO Authorized by: Kseniya Grunden, Layla Maw, DO     Interpretation: normal     ECG rate:  88   ECG rate assessment: normal     Rhythm: sinus rhythm     Ectopy: none     Conduction: normal       IMPRESSION / MDM / ASSESSMENT AND PLAN / ED COURSE  I reviewed the triage vital signs and the nursing notes.    Patient here with pleuritic chest pain.  The patient is on the cardiac monitor to evaluate for evidence of arrhythmia and/or significant heart rate changes.   DIFFERENTIAL DIAGNOSIS (includes but not limited to):   PE, less likely ACS or dissection.  Pain may also be musculoskeletal in nature or related to her radial hernia.  Doubt fracture, pneumonia, pneumothorax, CHF.   Patient's presentation is most consistent with acute presentation with potential threat to life or bodily function.   PLAN: Will obtain cardiac labs, D-dimer.  EKG nonischemic.  Will obtain chest x-ray.  Will give Toradol for pain control.   MEDICATIONS GIVEN IN ED: Medications  ketorolac (TORADOL) 30 MG/ML injection 30 mg  (30 mg Intravenous Given 12/25/22 0024)     ED COURSE: Patient's labs show normal hemoglobin, electrolytes.  Troponin negative.  No indication for serial enzymes given pain ongoing for an entire day.  D-dimer also negative.  Chest x-ray reviewed and interpreted by myself and the radiologist and is clear.  Patient is feeling better after Toradol.  Suspect this is musculoskeletal in nature.  Recommended using Tylenol, Motrin over-the-counter.  Had offered her a prescription for stronger pain medication for home which she declines.  At this time, I do not feel there is any life-threatening condition present. I reviewed all nursing notes, vitals, pertinent previous records.  All lab and urine results, EKGs, imaging ordered have been independently reviewed and interpreted by myself.  I reviewed all available radiology reports from any imaging ordered this visit.  Based on my assessment, I feel the patient is safe to be discharged home without further emergent workup and can continue workup as an outpatient as needed. Discussed all findings, treatment plan as well as usual and customary return precautions.  They verbalize understanding and are comfortable with this plan.  Outpatient follow-up has been provided as needed.  All questions have been answered.    CONSULTS:  none   OUTSIDE RECORDS REVIEWED: Reviewed last urology notes in April 2023.       FINAL CLINICAL IMPRESSION(S) / ED DIAGNOSES   Final diagnoses:  Pleuritic chest pain     Rx / DC Orders   ED Discharge Orders     None        Note:  This document was prepared using Dragon voice recognition software and may include unintentional dictation errors.   Geniene List, Layla Maw, DO 12/25/22 0126

## 2023-03-14 IMAGING — CR DG ABDOMEN 1V
1 series · 1 of 1 positions shown · non-contrast
Comparison: CT done on 05/24/2021

CLINICAL DATA: Renal stone, left ureteral stone

EXAM:
ABDOMEN - 1 VIEW

[dg abd 1 view]
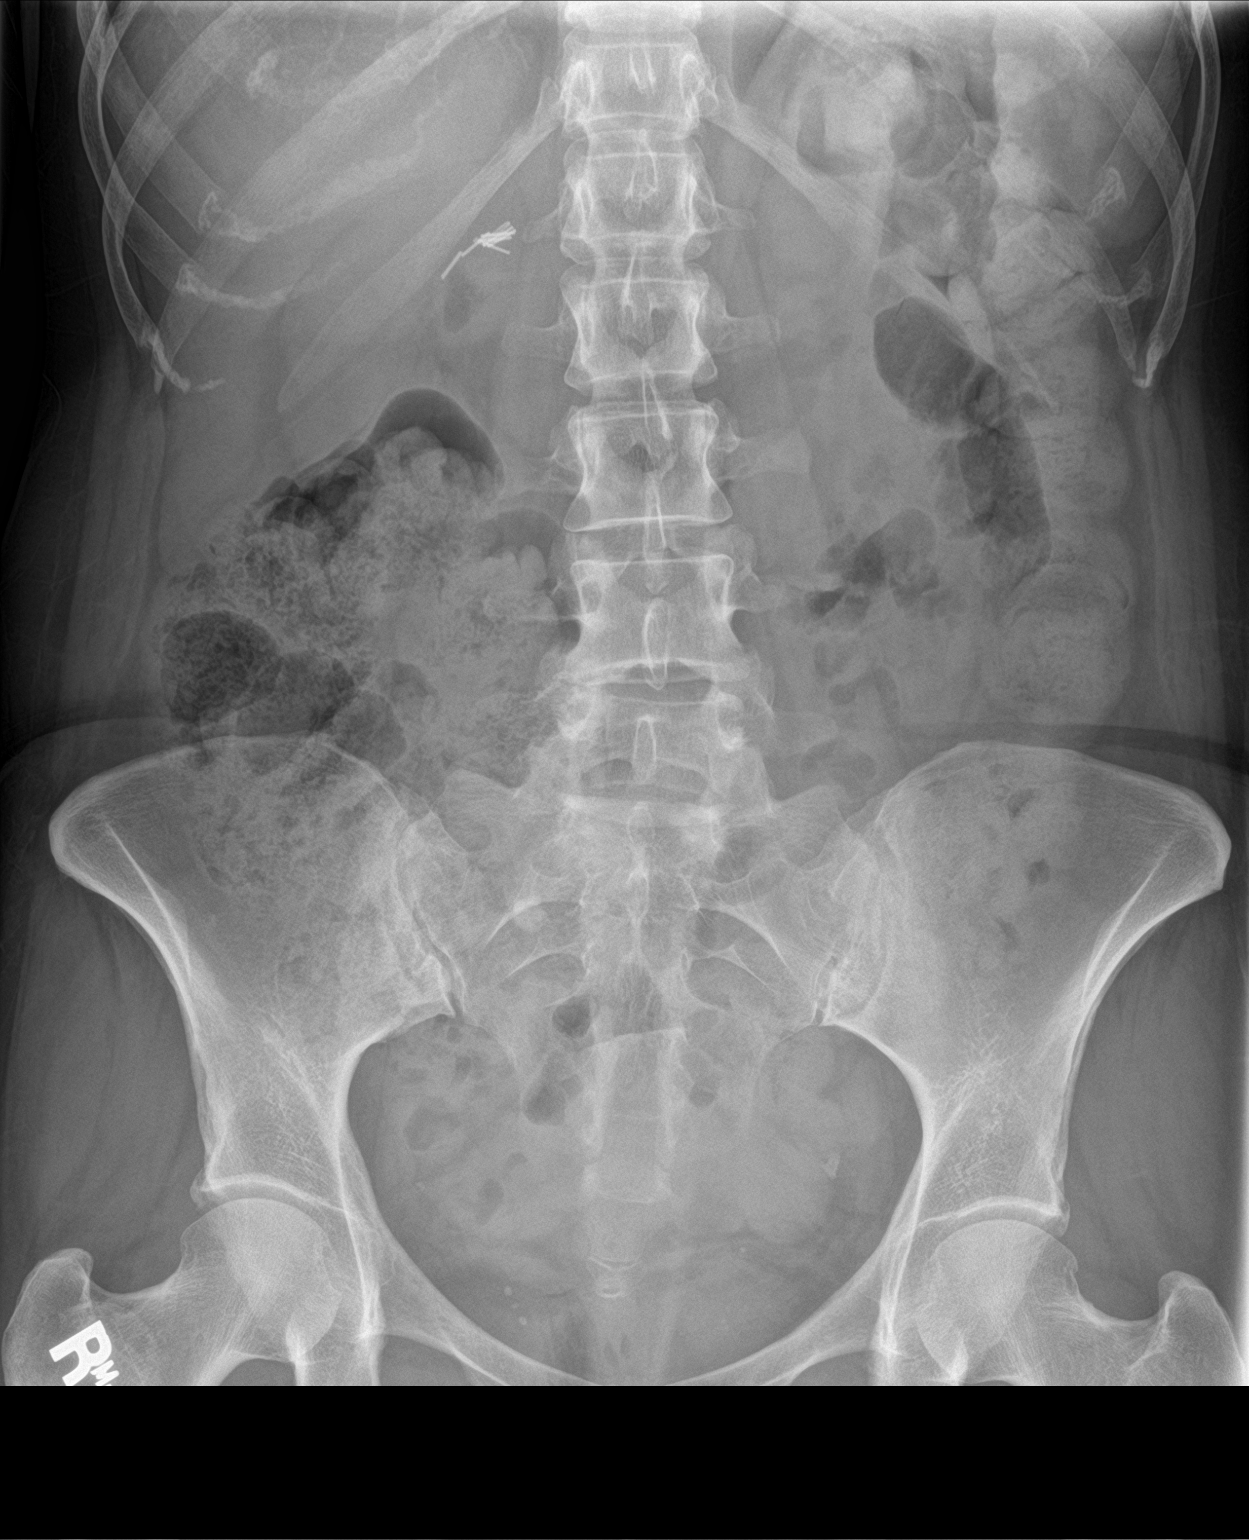

[1 of 1 positions shown; findings below may reference images not displayed]

FINDINGS: There is 7 mm calcific density in the left side of pelvis slightly
above the level of left hip joint. There are other small round
calcific densities in both sides of pelvis suggesting vascular
calcifications. Surgical clips are seen in the gallbladder fossa. No
abnormal calcifications are noted in the region of the kidneys.
Kidneys are partly obscured by bowel contents. Moderate amount of
stool is seen in colon without signs of fecal impaction in the
rectum.
IMPRESSION: There is 7 mm calcific density in the left side of pelvis in the
course of left ureter. There is slight distal migration of the left
ureteral calculus since the CT done on 05/24/2021.
